# Patient Record
Sex: Female | Born: 1975 | Race: White | Hispanic: No | Smoking: Never smoker
Health system: Southern US, Community
[De-identification: ages and names within clinical notes are randomized; demographics above are authoritative.]

## PROBLEM LIST (undated history)

## (undated) DIAGNOSIS — R760 Raised antibody titer: Secondary | ICD-10-CM

## (undated) DIAGNOSIS — C4490 Unspecified malignant neoplasm of skin, unspecified: Secondary | ICD-10-CM

## (undated) DIAGNOSIS — J020 Streptococcal pharyngitis: Secondary | ICD-10-CM

## (undated) DIAGNOSIS — I639 Cerebral infarction, unspecified: Secondary | ICD-10-CM

## (undated) HISTORY — DX: Streptococcal pharyngitis: J02.0

---

## 1998-12-01 ENCOUNTER — Other Ambulatory Visit: Admission: RE | Admit: 1998-12-01 | Discharge: 1998-12-01 | Payer: Self-pay | Admitting: Obstetrics and Gynecology

## 1999-07-20 ENCOUNTER — Other Ambulatory Visit: Admission: RE | Admit: 1999-07-20 | Discharge: 1999-07-20 | Payer: Self-pay | Admitting: Obstetrics and Gynecology

## 2000-07-25 ENCOUNTER — Other Ambulatory Visit: Admission: RE | Admit: 2000-07-25 | Discharge: 2000-07-25 | Payer: Self-pay | Admitting: Obstetrics and Gynecology

## 2001-06-24 ENCOUNTER — Other Ambulatory Visit: Admission: RE | Admit: 2001-06-24 | Discharge: 2001-06-24 | Payer: Self-pay | Admitting: *Deleted

## 2002-01-21 ENCOUNTER — Inpatient Hospital Stay (HOSPITAL_COMMUNITY): Admission: AD | Admit: 2002-01-21 | Discharge: 2002-01-25 | Payer: Self-pay | Admitting: *Deleted

## 2002-02-25 ENCOUNTER — Other Ambulatory Visit: Admission: RE | Admit: 2002-02-25 | Discharge: 2002-02-25 | Payer: Self-pay | Admitting: *Deleted

## 2003-04-20 ENCOUNTER — Other Ambulatory Visit: Admission: RE | Admit: 2003-04-20 | Discharge: 2003-04-20 | Payer: Self-pay | Admitting: *Deleted

## 2004-09-14 ENCOUNTER — Inpatient Hospital Stay (HOSPITAL_COMMUNITY): Admission: RE | Admit: 2004-09-14 | Discharge: 2004-09-17 | Payer: Self-pay | Admitting: *Deleted

## 2006-07-30 ENCOUNTER — Ambulatory Visit (HOSPITAL_COMMUNITY): Admission: RE | Admit: 2006-07-30 | Discharge: 2006-07-30 | Payer: Self-pay | Admitting: *Deleted

## 2006-07-30 IMAGING — US US OB DETAIL+14 WK
1 series · 14 of 28 positions shown · non-contrast
Comparison: none

OBSTETRICAL ULTRASOUND:
 This ultrasound was performed in The [HOSPITAL], and the [facility] report will be stored to [REDACTED] PACS.

[Series 1: us ob detail+14 wk · 14 of 115 slices shown]
[im 5/115]
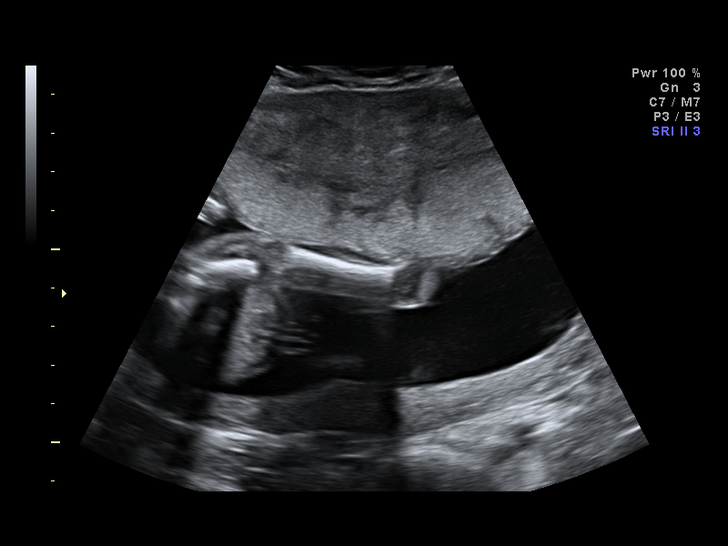
[im 13/115]
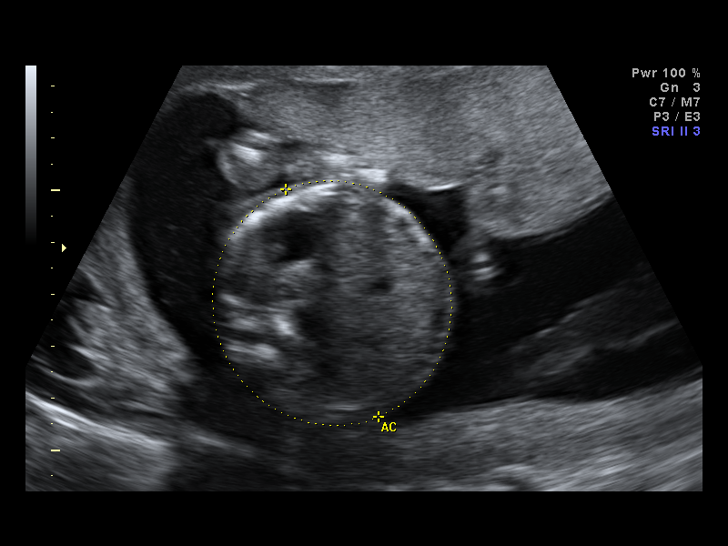
[im 22/115]
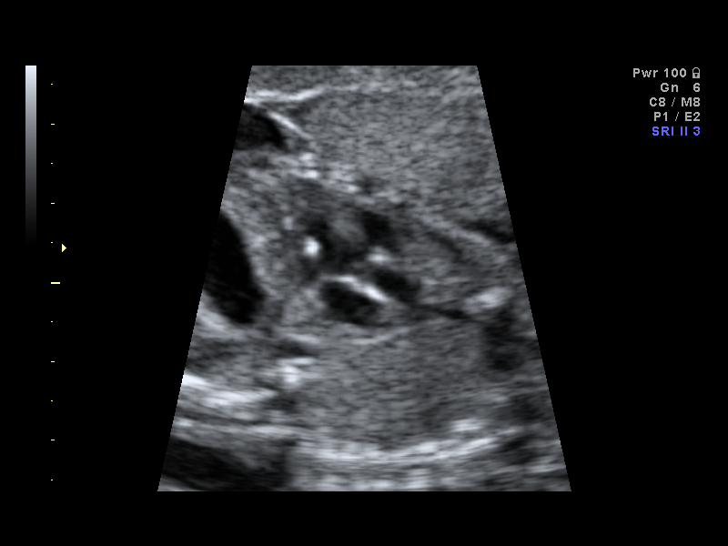
[im 30/115]
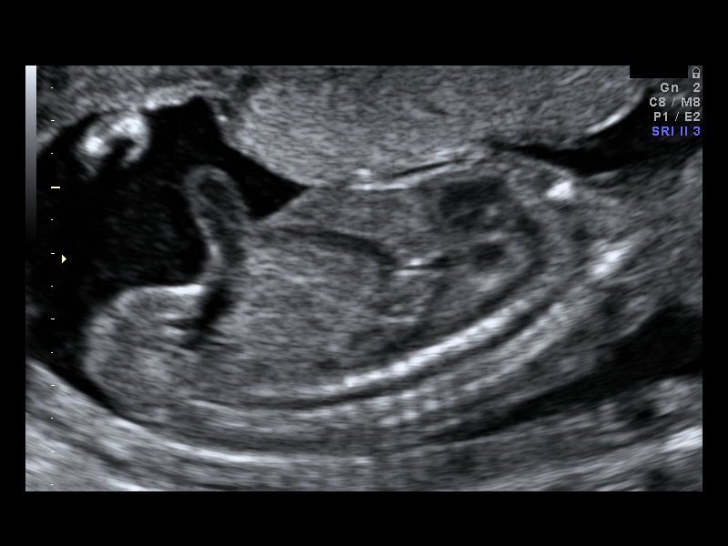
[im 39/115]
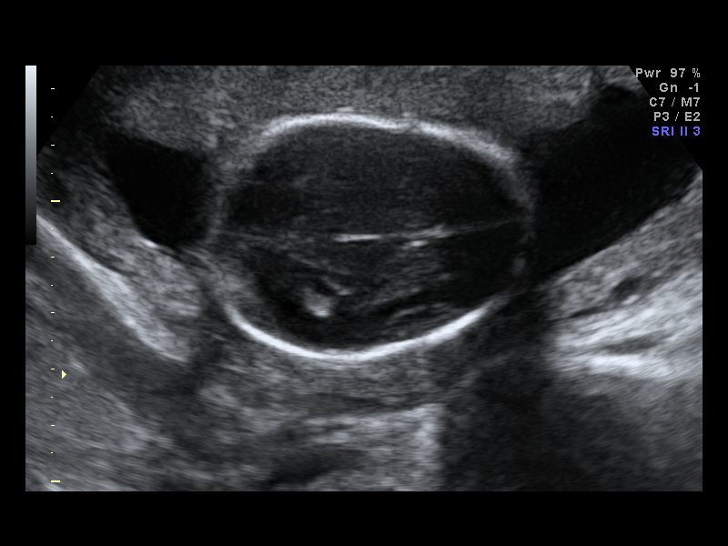
[im 47/115]
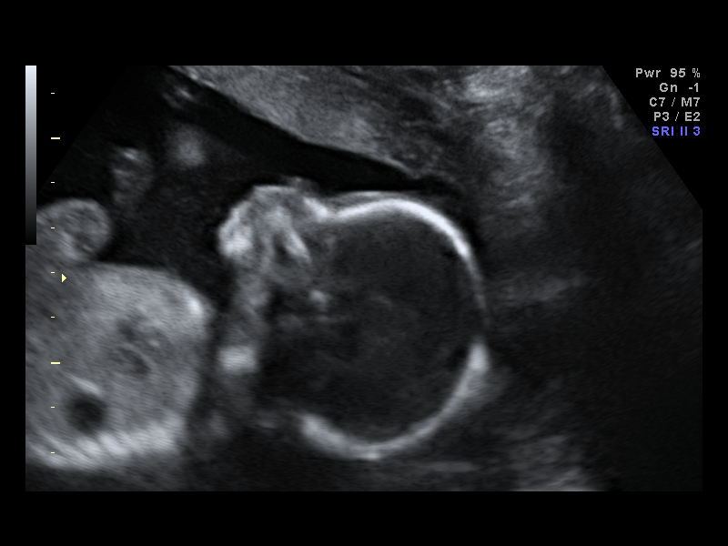
[im 55/115]
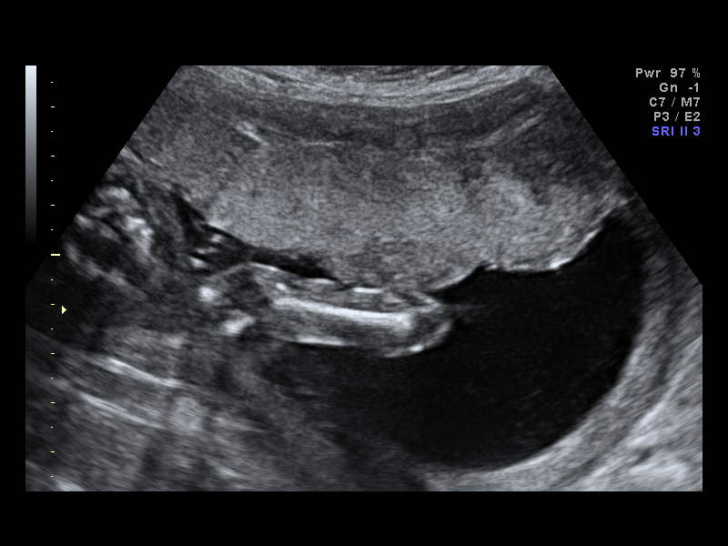
[im 64/115]
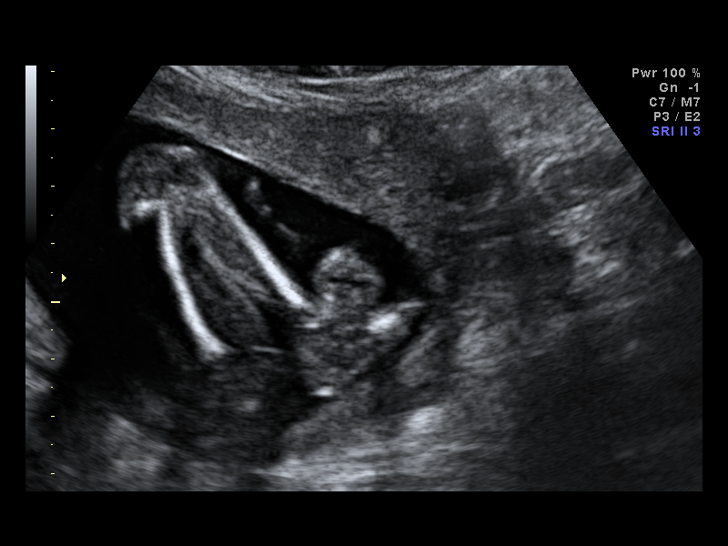
[im 72/115]
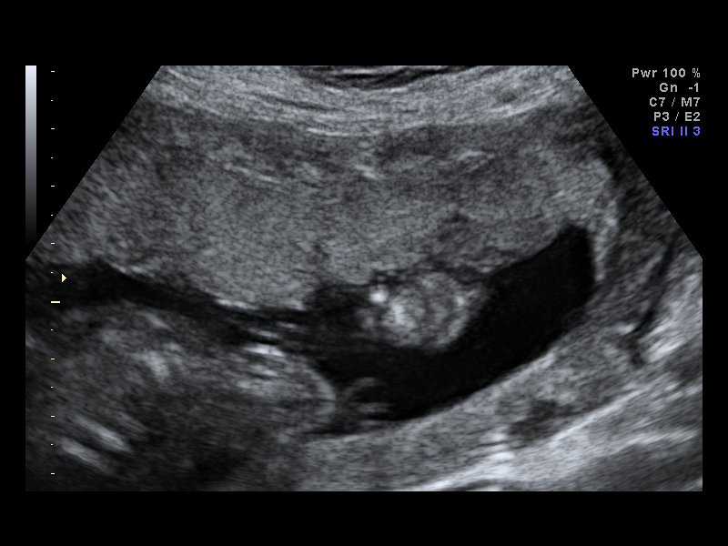
[im 81/115]
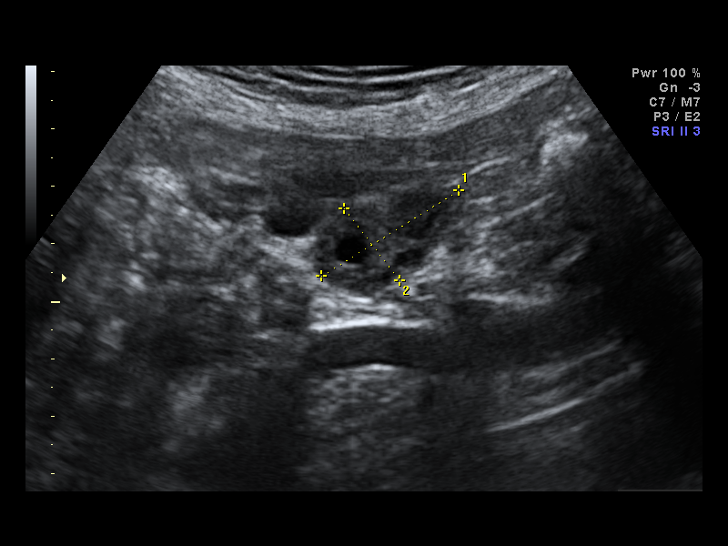
[im 89/115]
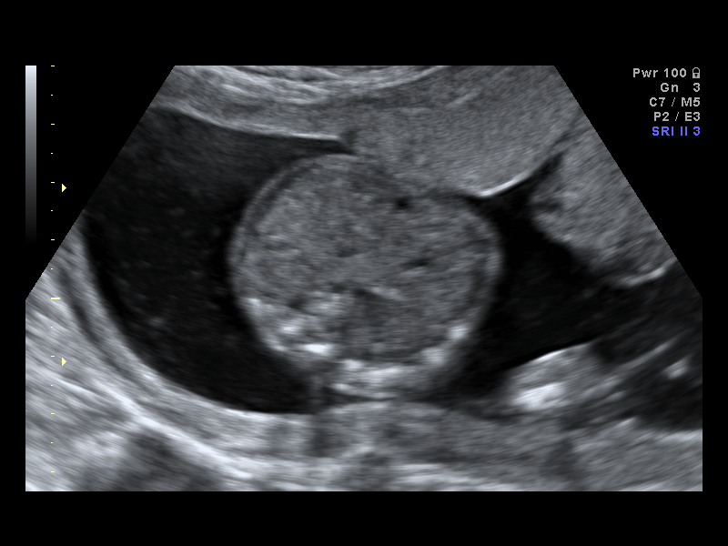
[im 98/115]
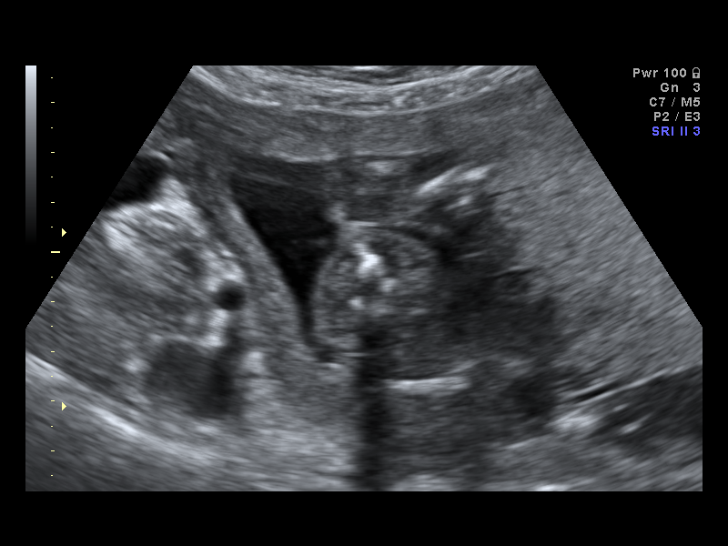
[im 106/115]
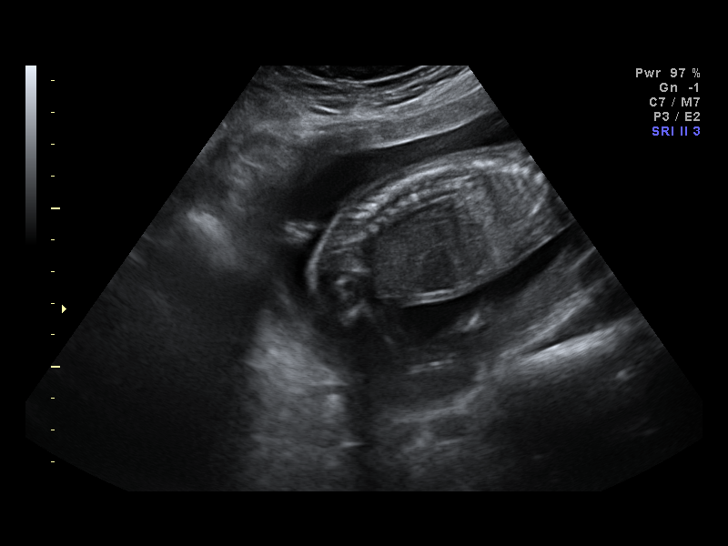
[im 115/115]
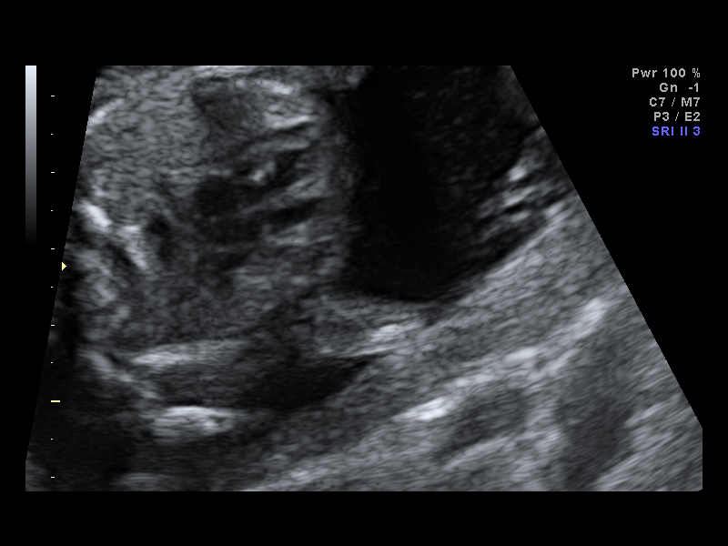

[14 of 28 positions shown; findings below may reference images not displayed]

IMPRESSION: The [facility] report has also been faxed to the ordering physician.

## 2006-11-29 ENCOUNTER — Inpatient Hospital Stay (HOSPITAL_COMMUNITY): Admission: AD | Admit: 2006-11-29 | Discharge: 2006-11-29 | Payer: Self-pay | Admitting: *Deleted

## 2006-12-11 ENCOUNTER — Inpatient Hospital Stay (HOSPITAL_COMMUNITY): Admission: AD | Admit: 2006-12-11 | Discharge: 2006-12-15 | Payer: Self-pay | Admitting: *Deleted

## 2006-12-11 ENCOUNTER — Encounter (INDEPENDENT_AMBULATORY_CARE_PROVIDER_SITE_OTHER): Payer: Self-pay | Admitting: *Deleted

## 2010-07-25 NOTE — Op Note (Signed)
NAMELEONIE, AMACHER              ACCOUNT NO.:  [id number]   MEDICAL RECORD NO.:  [id number]          PATIENT TYPE:  INP   LOCATION:  9106                          FACILITY:  [facility]   PHYSICIAN:  Gerri Spore B. Earlene Plater, M.D.  DATE OF BIRTH:  25-Oct-1975   DATE OF PROCEDURE:  12/11/2006  DATE OF DISCHARGE:                               OPERATIVE REPORT   PREOPERATIVE DIAGNOSES:  1. C-section x2.  2. A 37-week intrauterine pregnancy.  3. Spontaneous rupture of membranes.   POSTOPERATIVE DIAGNOSES:  1. C-section x2.  2. A 37-week intrauterine pregnancy.  3. Spontaneous rupture of membranes.   PROCEDURE:  Repeat low transverse C-section.   SURGEON:  Chester Holstein. Earlene Plater, M.D.   ASSISTANT:  Lendon Colonel, MD   ANESTHESIA:  Spinal.   SPECIMENS:  Placenta to pathology.   BLOOD LOSS:  900 mL.   COMPLICATIONS:  None.   FINDINGS:  Viable female, Apgars 5 and 9. Weight was pending from the  nursery.   INDICATIONS:  The patient presented with the above  history for repeat C-  section.   PROCEDURE:  The patient was taken to the operating room, spinal  anesthesia obtained.  She was prepped and draped in standard fashion.  Foley catheter was inserted into the bladder.  A Pfannenstiel skin  incision made through the previous incision and carried sharply to the  fascia.  The fascia was divided sharply in the midline.  This led to  opening of the peritoneum as well.  The remainder of the fascial  incision was extended laterally, after feeling under the fascia and  noting that it was free of any bowel adhesions or bladder adhesions.  The uterus was densely adherent to the abdominal wall and the inferior  rectus sheath on the right.  These adhesions were taken down sharply  without difficulty.   A bladder blade inserted.  A bladder flap created sharply.  Uterine  incision was made in low transverse fashion with a knife.  Clear fluid  at amniotomy; the vertex was elevated to the incision.  The  MightyVac  mushroom cup was placed on the flexion point, with a single traction;  one pop-off encountered, but the vertex delivered through the incision  without difficulty.  The nose and mouth were suctioned with the bulb;  the remainder of the infant delivered without difficulty.  The cord was  clamped and cut.  The infant was handed off to the awaiting  pediatricians.  Ancef 1 gram was given at cord clamp.   The placenta was removed manually.  The uterus was cleared of all clots  and debris.  The uterine incision was closed in 2 layers with 0 chromic,  with hemostasis obtained.   The pelvis was irrigated.  The uterine incision inspected, as was the  bladder flap and subfascial space.  All were hemostatic.  A layer of  Intercede was placed over the uterine incision.  The fascia was closed  with a running stitch of 0 Vicryl.  The subcutaneous tissue was  irrigated and made hemostatic with the Bovie.  Skin was closed with  staples.   The patient tolerated the procedure well; there were no complications.  She was taken to the recovery room; awake, alert and in stable  condition.  All counts were correct per the operating room scrub nurse.      Gerri Spore B. Earlene Plater, M.D.  Electronically Signed     WBD/MEDQ  D:  12/11/2006  T:  12/12/2006  Job:  [id number]

## 2010-07-28 NOTE — Discharge Summary (Signed)
NAMEANDALYN, HECKSTALL              ACCOUNT NO.:  [id number]   MEDICAL RECORD NO.:  [id number]          PATIENT TYPE:  INP   LOCATION:  9106                          [facility]ACILITY:  [facility]   PHYSICIAN:  Gerri Spore B. Earlene Plater, M.D.  DATE OF BIRTH:  08-16-1975   DATE OF ADMISSION:  12/11/2006  DATE OF DISCHARGE:  12/15/2006                               DISCHARGE SUMMARY   ADMISSION DIAGNOSES:  1. A 36-week intrauterine pregnancy,  2. Previous cesarean section x2.  3. Spontaneous rupture of membranes.   DISCHARGE DIAGNOSES:  1. A 36-week intrauterine pregnancy,  2. Previous cesarean section x2.  3. Spontaneous rupture of membranes.   PROCEDURES:  Repeat low transverse cesarean section.   HISTORY OF PRESENT ILLNESS:  A 35 year old white female, gravida 3, para  2, at 37+ weeks who experienced spontaneous rupture of membranes with  history of two previous cesarean sections for repeat. She was afebrile  prior to delivery.   HOSPITAL COURSE:  The patient was admitted, underwent repeat low  transverse cesarean section.  Findings at the time of surgery included  viable female.  Apgars were 5 and 9, cord pH  7..22.   Postoperatively, the patient regained her ability to ambulate, void and  tolerate a regular diet.  She was noted on the second postoperative day  to have low-grade fever of 100.5, mild fundal tenderness and generalized  malaise.  She was diagnosed with endometritis and was started on  gentamicin and clindamycin.  By the fourth postoperative day, the  patient had remained afebrile for greater than 24 hours, felt much  better, and no longer have fundal tenderness.  She was, therefore,  discharged home on the fourth postoperative day in satisfactory  condition.   DISCHARGE INSTRUCTIONS:  Per booklet.   DISCHARGE MEDICATIONS:  Tylox 1-2 p.o. q. 4-6 hours p.r.n. pain.   [facility]   DISCHARGE DISPOSITION:  Satisfactory.   Placental pathology showed mild  acute funisitis and meconium staining      Gerri Spore B. Earlene Plater, M.D.  Electronically Signed     WBD/MEDQ  D:  01/04/2007  T:  01/05/2007  Job:  [id number]

## 2010-07-28 NOTE — H&P (Signed)
NAME:  [name]                          ACCOUNT NO.:  [id number]   MEDICAL RECORD NO.:  [id number]                   PATIENT TYPE:   LOCATION:                                       FACILITY:   PHYSICIAN:  Gerri Spore B. Earlene Plater, M.D.               DATE OF BIRTH:   DATE OF ADMISSION:  01/21/2002  DATE OF DISCHARGE:                                HISTORY & PHYSICAL   ADMISSION DIAGNOSES:  1. A 39 plus week intrauterine pregnancy.  2. Elevated alpha-fetoprotein on triple screen.  3. Presumed macrosomia.   HISTORY OF PRESENT ILLNESS:  This is a 35 year old white female, gravida 1,  para 0, 39+ weeks, with EDC of January 24, 2002, admitted for induction of  labor.  She has been monitored with weekly nonstress testing and serial  ultrasounds for growth for unexplained elevated ASP.  Ultrasound of the  spine showed no defects and the patient declined amniocentesis.  The  patient's most recent ultrasound for growth at 37 weeks showed estimated  fetal weight at 8 pounds 9 ounces with normal amniotic fluid volume.  I have  recommended that the patient not progress ______________ today, given her  elevated AFP and a known association with increased risk of stillbirth.  Otherwise prenatal care has been uncomplicated, except for history of HSV of  the buttocks.  She has had no recent symptoms and is taking Valtrex for  suppressive therapy.   PAST OBSTETRICAL HISTORY:  None.   PAST SURGICAL HISTORY:  None.   FAMILY HISTORY:  Thyroid dysfunction and heart disease.   MEDICATIONS:  1. Valtrex 500 mg daily.  2. Prenatal vitamin one daily.   ALLERGIES:  None.   SOCIAL HISTORY:  No alcohol, tobacco, or drugs.   PRENATAL LABORATORY:  O positive, rubella immune.  Hepatitis B, HIV, RPR all  negative.  Group B Strep negative.   PHYSICAL EXAMINATION:  VITAL SIGNS:  Blood pressure 118/68, weight 177.  Fetal heart tones 150.  GENERAL:  Alert and oriented, no acute distress.  SKIN:  Warm and  dry, no lesions.  HEART:  Regular rate and rhythm.  LUNGS:  Clear to auscultation.  ABDOMEN:  Gravid.  Fundal height 42 cm.  PELVIC:  Cervix is 1 cm dilated, 50% effaced, -1 station, and vertex.   ASSESSMENT:  1. A 39+ week intrauterine pregnancy, elevated AFP, declined amniocentesis.  2. Presumed macrosomia.  We have discussed limitations of ultrasound in     terms of predicting the actual birth weight; however, as the patient is     now approaching 40 weeks with an elevated alpha-fetoprotein, I have     recommended induction of labor.  We discussed the possibility that     induction might increase the risk of cesarean section, as may macrosomia.  Gerri Spore B. Earlene Plater, M.D.    WBD/MEDQ  D:  01/20/2002  T:  01/20/2002  Job:  [id number]

## 2010-07-28 NOTE — Op Note (Signed)
[name], [name]                          ACCOUNT NO.:  [id number]   MEDICAL RECORD NO.:  [id number]                   PATIENT TYPE:  INP   LOCATION:  9125                                 [facility]ACILITY:  [facility]   PHYSICIAN:  Gerri Spore B. Earlene Plater, M.D.               DATE OF BIRTH:  1975-12-29   DATE OF PROCEDURE:  01/22/2002  DATE OF DISCHARGE:  01/25/2002                                 OPERATIVE REPORT   PREOPERATIVE DIAGNOSIS:  Failure to descend.   POSTOPERATIVE DIAGNOSIS:  Failure to descend.   PROCEDURE:  Primary low transverse cesarean section.   SURGEON:  Chester Holstein. Earlene Plater, M.D.   ASSISTANT:  Maxie Better, M.D.   FINDINGS:  Viable female infant, OP position, weight 9 pounds 8 ounces, Apgars  8 and 9.  Normal uterus, tubes and ovaries.  Blood loss 800 cc,  ______________.  Urine 275.   COMPLICATIONS:  None.   INDICATIONS FOR PROCEDURE:  The patient was being admitted at 39+ weeks for  induction of labor with history of unexplained elevated AFP and presenting  macrosomia.  The patient progressed well with Pitocin and amniotomy,  ultimately to complete and +1.  She pushed for about two hours with no  descent beyond +1 with persistent OP position.  As there was no descent for  two hours despite adequate pushing and the patient felt unable to continue  to push, we recommended cesarean section.   DESCRIPTION OF PROCEDURE:  The patient was taken to the operating room with  epidural anesthesia in place.  She was prepped and draped in standard  fashion and Foley catheter was already in the bladder.  A Pfannenstiel  incision was made with a knife and carried sharply to underlying fascia.  The fascia was divided in the midline with the knife and extended laterally  with Mayo scissors.  The Kocher clamp was used to elevate the superior  aspect of the incision and the underlying rectus muscles are dissected off  sharply.  __________________ in similar fashion.   The posterior sheath  and peritoneum were elevated at the midline and entered  sharply.  Extended inferiorly with good visualization of surrounding organs.   Bladder blade was inserted and the vesicouterine peritoneum identified and  elevated and bladder flap created with sharp and blunt technique.  Bladder  blade was reinserted and the uterine incision made in low transverse fashion  with a knife.  Extended laterally with bandage scissors.  Clear fluid noted  at amniotomy.  The infant's head was brought through the incision. Nose and  mouth suctioned with Bovie and the remainder of the infant was delivered  without difficulty.  Cord was clamped and cut and baby handed off to  awaiting pediatricians.  The patient was given 2 g of Cefotan at cord clamp.   The placenta was removed by manual expression and the uterus exteriorized  and clear of all clots  and debris.  The uterine incision was free of  extension.  It was closed in running stitch of 0 Monocryl.  The second  imbricating layer placed and hemostasis obtained.  Tubes and ovaries were  inspected and appeared normal.   Uterus was reinserted and the pelvis irrigated.  The uterine incision,  bladder flap and subfascial space were all inspected and were all  hemostatic.   The fascial incision was closed with running suture of 0 Vicryl.  Subcutaneous tissue was irrigated and made hemostatic with a Bovie.  Skin  was closed with staples.   The patient tolerated the procedure well without complications.  She was  taken to the recovery room awake, alert and in stable condition.  All counts  correct per the operating staff.                                               Gerri Spore B. Earlene Plater, M.D.    WBD/MEDQ  D:  03/11/2002  T:  03/11/2002  Job:  [id number]

## 2010-07-28 NOTE — Op Note (Signed)
NAMECALIAH, [name]              ACCOUNT NO.:  [id number]   MEDICAL RECORD NO.:  [id number]          PATIENT TYPE:  INP   LOCATION:  9199                          [facility]ACILITY:  [facility]   PHYSICIAN:  Gerri Spore B. Earlene Plater, M.D.  DATE OF BIRTH:  August 29, 1975   DATE OF PROCEDURE:  09/14/2004  DATE OF DISCHARGE:                                 OPERATIVE REPORT   PREOPERATIVE DIAGNOSES:  1.  Thirty-nine week intrauterine pregnancy.  2.  Previous cesarean section.  3.  Suspected large for gestational age fetus.   POSTOPERATIVE DIAGNOSES:  1.  Thirty-nine week intrauterine pregnancy.  2.  Previous cesarean section.  3.  Suspected large for gestational age fetus.   PROCEDURE:  Repeat low transverse cesarean section.   SURGEON:  Chester Holstein. Earlene Plater, M.D.   ASSISTANT:  Richardean Sale, M.D.   ANESTHESIA:  Spinal.   SPECIMENS:  None.   ESTIMATED BLOOD LOSS:  750.   COMPLICATIONS:  None.   FINDINGS:  1.  Uterus adherent in the lower segment to the abdominal wall in the right      lower quadrant.  2.  A viable female, weight 9 pounds 11 ounces, Apgars 8 and 9.  3.  Normal-appearing uterus otherwise, normal-appearing tubes and ovaries.   INDICATIONS:  Patient with previous cesarean section and suspected LGA  fetus, for repeat.  The patient was advised of the risks of surgery  including infection, bleeding, damage to surrounding organs.   PROCEDURE:  The patient was taken to the operating room and spinal  anesthesia obtained.  Prepped and draped in standard fashion and Foley  catheter inserted into the bladder.  Pfannenstiel incision made through  previous scar.  Carried to the fascia sharply.  The fascia was divided  sharply.  The underlying rectus muscles were dissected off sharply.  The  posterior sheath and peritoneum entered sharply and the uterus noted to be  adherent in the right lower quadrant to the abdominal wall.  These adhesions  were taken down sharply, bladder flap inserted, bladder  flap created with  sharp and blunt technique.   Uterine incision made in a low transverse fashion with a knife.  Clear fluid  at amniotomy.  The incision extended laterally with bandage scissors.  The  infant's head was elevated through the incision, the nose and mouth  suctioned with a bulb.  The remainder of the infant delivered without  difficulty.  Cord clamped and cut and the infant was handed off to the  awaiting pediatricians.  Ancef 1 g was given at cord clamp.   The placenta was delivered manually, uterus exteriorized and cleared of all  clots and debris.  The uterine incision was free of extension.  It was  closed in a running locked stitch of 0 chromic.  A second imbricating layer  placed of the same suture with hemostasis obtained.  The area that had been  adherent to the abdominal wall on the uterus was made hemostatic with the  Bovie and the uterus was otherwise normal.  Tubes and ovaries were normal.  The uterus was reinserted into the abdomen  and the pelvis was irrigated.  The uterine incision and the bladder flap were reinspected and both were  hemostatic, as was the subfascial space.   The fascia was closed in a running stitch of 0 Vicryl.  The subcutaneous  tissue was irrigated and made hemostatic with the Bovie.  Skin was closed  with staples.   The patient tolerated the procedure without any complications.  She was  taken to the recovery room awake and alert, in stable condition.  All counts  were correct per the operating room staff.       WBD/MEDQ  D:  09/14/2004  T:  09/14/2004  Job:  [id number]

## 2010-07-28 NOTE — Discharge Summary (Signed)
   NAMECHANLEY, [name]                          ACCOUNT NO.:  [id number]   MEDICAL RECORD NO.:  [id number]                   PATIENT TYPE:  INP   LOCATION:  9125                                 [facility]ILITY[facility]   PHYSICIAN:  Gerri Spore B. Earlene Plater, M.D.               DATE OF BIRTH:  04/12/1975   DATE OF ADMISSION:  01/21/2002  DATE OF DISCHARGE:  01/25/2002                                 DISCHARGE SUMMARY   ADMISSION DIAGNOSES:  1. Intrauterine pregnancy at 39+ weeks.  2. Unexplained elevated alpha-fetoprotein.  3. Presumed fetal macrosomia.   DISCHARGE DIAGNOSES:  1. Intrauterine pregnancy at 39+ weeks.  2. Unexplained elevated alpha-fetoprotein.  3. Presumed fetal macrosomia.  4. Failure to descend.   PROCEDURES:  1. Induction of labor.  2. Cesarean section for failure to descend.   HISTORY OF PRESENT ILLNESS:  For complete details please see the History and  Physical on the chart.  Briefly, the patient was admitted at 39+ weeks as a  gravida 1  para 0 for induction of labor for the above issues.   HOSPITAL COURSE:  The patient was admitted, induction started with Pitocin  and amniotomy.  The patient progressed to complete/complete and pushed for  greater than two hours with no descent beyond +1 station.  Given this,  recommended to proceed with cesarean section.  The patient did develop fever  around the time of initiation of the C section.   The patient was subsequently delivered by primary low transverse cesarean  section of a viable female infant, OP position, 9 pounds  8 ounces, Apgars 8 and 9 noted.  Normal uterus, tubes, and ovaries were also  seen.   The patient postoperatively rapidly began ambulating and voiding and  tolerating a regular diet.  She was kept on IV Ceftin 24 hours postpartum  with no further fever.  She was discharged home on postoperative day #3 in  satisfactory condition.  She was noted to be A positive with a discharge  hemoglobin of 8.4.   DISCHARGE MEDICATIONS:  1. Tylox one to two p.o. q.4-6h. p.r.n. pain.  2. Ferrous sulfate 325 mg p.o. b.i.d.   FOLLOW-UP:  [facility] in four to six weeks.    DISCHARGE INSTRUCTIONS:  Standard printed instructions given prior to  dismissal.   DISPOSITION AT DISCHARGE:  Satisfactory.                                               Gerri Spore B. Earlene Plater, M.D.    WBD/MEDQ  D:  02/18/2002  T:  02/18/2002  Job:  [id number]

## 2010-07-28 NOTE — Discharge Summary (Signed)
NAMESHIRRELL, [name]              ACCOUNT NO.:  [id number]   MEDICAL RECORD NO.:  [id number]          PATIENT TYPE:  INP   LOCATION:  9122                          [facility]   PHYSICIAN:  Gerri Spore B. Earlene Plater, M.D.  DATE OF BIRTH:  10/31/1975   DATE OF ADMISSION:  09/14/2004  DATE OF DISCHARGE:  09/17/2004                                 DISCHARGE SUMMARY   ADMISSION DIAGNOSES:  1.  A 39 week intrauterine pregnancy.  2.  History of previous cesarean section, for repeat.  3.  History of postpartum depression.   DISCHARGE DIAGNOSES:  1.  A 39 week intrauterine pregnancy.  2.  History of previous cesarean section, for repeat.  3.  History of postpartum depression.   PROCEDURE:  Repeat low transverse cesarean section.   HISTORY OF PRESENT ILLNESS:  A 35 year old white female gravida 2, para 1,  39 weeks, history of previous C-section for repeat.   HOSPITAL COURSE:  The patient underwent repeat low transverse cesarean  section on day of admission.  Findings included viable female, weight 9  pounds, 11 ounces, Apgars were 8 and 9.  There were some adhesions at the  uterus to the abdominal wall in the right lower quadrant which were taken  down sharply, otherwise no abnormalities noted.   Postoperatively, the patient rapidly regained her ability to ambulate, void,  and tolerate a regular diet.  She continued her Zoloft 50 mg daily but on  the third postoperative day was noticing some increased tearfulness and mild  increased depressed mood.  This was discussed at length with the patient.  She does desire discharge home.  She states she has good family support and  that her husband will be off for the next two weeks and she has family  coming in from out of town which will also be supportive and helpful.   DISCHARGE DIAGNOSES:  Standard preprinted instructions given prior to  dismissal.  In addition, the patient is instructed to call with any  increasing mood-related symptoms.   DISCHARGE MEDICATIONS:  1.  Zoloft-will increase to 100 mg p.o. q.h.s. given the recent increase in      symptoms.  2.  Tylox 1-2 p.o. q.4-6h. p.r.n. pain.   FOLLOW UP:  Dr Earlene Plater, one week.   DISPOSITION AT DISCHARGE:  Satisfactory.       WBD/MEDQ  D:  09/17/2004  T:  09/17/2004  Job:  [id number]

## 2010-12-21 LAB — CBC
HCT: 30.5 — ABNORMAL LOW
Hemoglobin: 10.4 — ABNORMAL LOW
Hemoglobin: 12.6
MCHC: 33.7
MCV: 87.7
Platelets: 125 — ABNORMAL LOW
Platelets: 139 — ABNORMAL LOW
Platelets: 160
RBC: 3.47 — ABNORMAL LOW
RBC: 3.51 — ABNORMAL LOW
RDW: 15.4 — ABNORMAL HIGH
WBC: 15.8 — ABNORMAL HIGH
WBC: 16.4 — ABNORMAL HIGH

## 2010-12-21 LAB — RPR: RPR Ser Ql: NONREACTIVE

## 2010-12-21 LAB — URINALYSIS, ROUTINE W REFLEX MICROSCOPIC
Bilirubin Urine: NEGATIVE
Glucose, UA: NEGATIVE
Hgb urine dipstick: NEGATIVE
Specific Gravity, Urine: 1.01
Urobilinogen, UA: 0.2
pH: 6.5

## 2013-03-26 ENCOUNTER — Ambulatory Visit (INDEPENDENT_AMBULATORY_CARE_PROVIDER_SITE_OTHER): Payer: 59 | Admitting: Physician Assistant

## 2013-03-26 VITALS — BP 110/64 | HR 68 | Temp 99.5°F | Resp 18 | Ht 63.0 in | Wt 125.0 lb

## 2013-03-26 DIAGNOSIS — J029 Acute pharyngitis, unspecified: Secondary | ICD-10-CM

## 2013-03-26 DIAGNOSIS — J020 Streptococcal pharyngitis: Secondary | ICD-10-CM

## 2013-03-26 LAB — POCT RAPID STREP A (OFFICE): Rapid Strep A Screen: POSITIVE — AB

## 2013-03-26 MED ORDER — AMOXICILLIN 875 MG PO TABS: 875.0000 mg | ORAL_TABLET | Freq: Two times a day (BID) | ORAL | Status: DC

## 2013-03-26 NOTE — Patient Instructions (Signed)
Get plenty of rest and drink at least 64 ounces of water daily.  Strep Throat Strep throat is an infection of the throat caused by a bacteria named Streptococcus pyogenes. Your caregiver may call the infection streptococcal "tonsillitis" or "pharyngitis" depending on whether there are signs of inflammation in the tonsils or back of the throat. Strep throat is most common in children aged 39 15 years during the cold months of the year, but it can occur in people of any age during any season. This infection is spread from person to person (contagious) through coughing, sneezing, or other close contact. SYMPTOMS   Fever or chills.  Painful, swollen, red tonsils or throat.  Pain or difficulty when swallowing.  White or yellow spots on the tonsils or throat.  Swollen, tender lymph nodes or "glands" of the neck or under the jaw.  Red rash all over the body (rare). DIAGNOSIS  Many different infections can cause the same symptoms. A test must be done to confirm the diagnosis so the right treatment can be given. A "rapid strep test" can help your caregiver make the diagnosis in a few minutes. If this test is not available, a light swab of the infected area can be used for a throat culture test. If a throat culture test is done, results are usually available in a day or two. TREATMENT  Strep throat is treated with antibiotic medicine. HOME CARE INSTRUCTIONS   Gargle with 1 tsp of salt in 1 cup of warm water, 3 4 times per day or as needed for comfort.  Family members who also have a sore throat or fever should be tested for strep throat and treated with antibiotics if they have the strep infection.  Make sure everyone in your household washes their hands well.  Do not share food, drinking cups, or personal items that could cause the infection to spread to others.  You may need to eat a soft food diet until your sore throat gets better.  Drink enough water and fluids to keep your urine clear or  pale yellow. This will help prevent dehydration.  Get plenty of rest.  Stay home from school, daycare, or work until you have been on antibiotics for 24 hours.  Only take over-the-counter or prescription medicines for pain, discomfort, or fever as directed by your caregiver.  If antibiotics are prescribed, take them as directed. Finish them even if you start to feel better. SEEK MEDICAL CARE IF:   The glands in your neck continue to enlarge.  You develop a rash, cough, or earache.  You cough up green, yellow-brown, or bloody sputum.  You have pain or discomfort not controlled by medicines.  Your problems seem to be getting worse rather than better. SEEK IMMEDIATE MEDICAL CARE IF:   You develop any new symptoms such as vomiting, severe headache, stiff or painful neck, chest pain, shortness of breath, or trouble swallowing.  You develop severe throat pain, drooling, or changes in your voice.  You develop swelling of the neck, or the skin on the neck becomes red and tender.  You have a fever.  You develop signs of dehydration, such as fatigue, dry mouth, and decreased urination.  You become increasingly sleepy, or you cannot wake up completely. Document Released: 02/24/2000 Document Revised: 02/13/2012 Document Reviewed: 04/27/2010 [facility] Patient Information 2014 [place], [place].

## 2013-03-26 NOTE — Progress Notes (Signed)
   Subjective:    Patient ID: [name], female    DOB: 1975/11/04, 38 y.o.   MRN: [id number]  PCP: No primary provider on file.  Chief Complaint  Patient presents with  . Sore Throat    x2 days     HPI  2-3 days of sore throat.  Gets this regularly, usually this time of year, sometimes "back to back."  Last episode was about a month ago.  Diagnosed by rapid strep test at work and treated with Augmentin, followed by Diflucan for a yeast infection.  Exercises regularly.  No runny nose.  No cough.  Some ear discomfort. No chills, low grade fever.  No muscle aches.  Has been very tired. No nausea, vomiting or diarrhea.  No urinary symptoms.   Review of Systems As above.    Objective:   Physical Exam  Vitals reviewed. Constitutional: She is oriented to person, place, and time. Vital signs are normal. She appears well-developed and well-nourished. No distress.  HENT:  Head: Normocephalic and atraumatic.  Right Ear: Hearing, tympanic membrane, external ear and ear canal normal.  Left Ear: Hearing, tympanic membrane, external ear and ear canal normal.  Nose: Mucosal edema present. No rhinorrhea.  No foreign bodies. Right sinus exhibits no maxillary sinus tenderness and no frontal sinus tenderness. Left sinus exhibits no maxillary sinus tenderness and no frontal sinus tenderness.  Mouth/Throat: Uvula is midline and mucous membranes are normal. No uvula swelling. Posterior oropharyngeal erythema present. No oropharyngeal exudate, posterior oropharyngeal edema or tonsillar abscesses.  Eyes: Conjunctivae and EOM are normal. Pupils are equal, round, and reactive to light. Right eye exhibits no discharge. Left eye exhibits no discharge. No scleral icterus.  Neck: Trachea normal, normal range of motion and full passive range of motion without pain. Neck supple. No mass and no thyromegaly present.  Cardiovascular: Normal rate, regular rhythm and normal heart sounds.   Pulmonary/Chest:  Effort normal and breath sounds normal.  Lymphadenopathy:       Head (right side): No submandibular, no tonsillar, no preauricular, no posterior auricular and no occipital adenopathy present.       Head (left side): No submandibular, no tonsillar, no preauricular and no occipital adenopathy present.    She has no cervical adenopathy.       Right: No supraclavicular adenopathy present.       Left: No supraclavicular adenopathy present.  Neurological: She is alert and oriented to person, place, and time. She has normal strength. No cranial nerve deficit or sensory deficit.  Skin: Skin is warm, dry and intact. No rash noted.  Psychiatric: She has a normal mood and affect. Her speech is normal and behavior is normal.      Results for orders placed in visit on 03/26/13  POCT RAPID STREP A (OFFICE)      Result Value Range   Rapid Strep A Screen Positive (*) Negative       Assessment & Plan:  1. Acute pharyngitis 2. Strep pharyngitis Supportive care.  Anticipatory guidance.  RTC if symptoms worsen/persist. - POCT rapid strep A - amoxicillin (AMOXIL) 875 MG tablet; Take 1 tablet (875 mg total) by mouth 2 (two) times daily.  Dispense: 20 tablet; Refill: 0   Fara Chute, PA-C Physician Assistant-Certified [facility]

## 2016-05-28 ENCOUNTER — Ambulatory Visit (INDEPENDENT_AMBULATORY_CARE_PROVIDER_SITE_OTHER): Payer: 59 | Admitting: Family Medicine

## 2016-05-28 VITALS — BP 126/78 | HR 66 | Temp 98.0°F | Resp 16 | Ht 63.0 in | Wt 140.6 lb

## 2016-05-28 DIAGNOSIS — L732 Hidradenitis suppurativa: Secondary | ICD-10-CM

## 2016-05-28 MED ORDER — SULFAMETHOXAZOLE-TRIMETHOPRIM 800-160 MG PO TABS: 1.0000 | ORAL_TABLET | Freq: Two times a day (BID) | ORAL | 0 refills | Status: AC

## 2016-05-28 MED ORDER — FLUCONAZOLE 150 MG PO TABS: 150.0000 mg | ORAL_TABLET | Freq: Once | ORAL | 0 refills | Status: AC

## 2016-05-28 NOTE — Progress Notes (Signed)
  Chief Complaint  Patient presents with  . Cyst    3 under right armpit    HPI   Pt reports that she went to [facility] for a cyst her right axillae She reports that she was prescribed doxycycline 3 weeks ago She reports that 5 days ago she she started having another recurrence in the same right axillae   Past Medical History:  Diagnosis Date  . Strep pharyngitis     Current Outpatient Prescriptions  Medication Sig Dispense Refill  . norethindrone-ethinyl estradiol (JUNEL FE,GILDESS FE,LOESTRIN FE) 1-20 MG-MCG tablet Take 1 tablet by mouth daily.    Marland Kitchen amoxicillin (AMOXIL) 875 MG tablet Take 1 tablet (875 mg total) by mouth 2 (two) times daily. (Patient not taking: Reported on 05/28/2016) 20 tablet 0  . sulfamethoxazole-trimethoprim (BACTRIM DS,SEPTRA DS) 800-160 MG tablet Take 1 tablet by mouth 2 (two) times daily. 14 tablet 0   No current facility-administered medications for this visit.     Allergies: No Known Allergies  Past Surgical History:  Procedure Laterality Date  . CESAREAN SECTION      Social History   Social History  . Marital status: Divorced    Spouse name: n/a  . Number of children: 3  . Years of education: N/A   Occupational History  . [place] [employer] History Main Topics  . Smoking status: Never Smoker  . Smokeless tobacco: Never Used  . Alcohol use No  . Drug use: No  . Sexual activity: Not Asked   Other Topics Concern  . None   Social History Narrative   Lives with her 3 children. And their dog.    Review of Systems  Constitutional: Negative for chills and fever.  Musculoskeletal: Negative for myalgias and neck pain.  Skin: Negative for itching and rash.    Objective: Vitals:   05/28/16 0957  BP: 126/78  Pulse: 66  Resp: 16  Temp: 98 F (36.7 C)  TempSrc: Oral  SpO2: 98%  Weight: 140 lb 9.6 oz (63.8 kg)  Height: 5' 3"  (1.6 m)    Physical Exam  Constitutional: She is oriented to person,  place, and time. She appears well-developed and well-nourished.  HENT:  Head: Normocephalic and atraumatic.  Pulmonary/Chest: Effort normal.  Musculoskeletal: Normal range of motion.  Neurological: She is alert and oriented to person, place, and time.  Skin:  Right axillae with 3 nodular lesions with palpable fluctuance. No erythema     Assessment and Plan Tarita was seen today for cyst.  Diagnoses and all orders for this visit:  Hidradenitis suppurativa of right axilla  Pt declined I&D today She has appt with Dermatology in 3 days Will start Bactrim DS today Since she completed 2 courses of doxycycline with recurrence Discussed risk and side effects of Bactrim including rash, diarrhea, nausea or vomiting Discussed that it is not known who gets reactions to antibiotics and who does not -     sulfamethoxazole-trimethoprim (BACTRIM DS,SEPTRA DS) 800-160 MG tablet; Take 1 tablet by mouth 2 (two) times daily.     [place]

## 2016-05-28 NOTE — Patient Instructions (Addendum)
IF you received an x-ray today, you will receive an invoice from [facility]. Please contact [facility] at [phone] with questions or concerns regarding your invoice.   IF you received labwork today, you will receive an invoice from [facility]. Please contact [facility] at [phone] with questions or concerns regarding your invoice.   Our billing staff will not be able to assist you with questions regarding bills from these companies.  You will be contacted with the lab results as soon as they are available. The fastest way to get your results is to activate your My Chart account. Instructions are located on the last page of this paperwork. If you have not heard from [place] regarding the results in 2 weeks, please contact this office.     Hidradenitis Suppurativa Hidradenitis suppurativa is a long-term (chronic) skin disease that starts with blocked sweat glands or hair follicles. Bacteria may grow in these blocked openings of your skin. Hidradenitis suppurativa is like a severe form of acne that develops in areas of your body where acne would be unusual. It is most likely to affect the areas of your body where skin rubs against skin and becomes moist. This includes your:  Underarms.  Groin.  Genital areas.  Buttocks.  Upper thighs.  Breasts. Hidradenitis suppurativa may start out with small pimples. The pimples can develop into deep sores that break open (rupture) and drain pus. Over time your skin may thicken and become scarred. Hidradenitis suppurativa cannot be passed from person to person. What are the causes? The exact cause of hidradenitis suppurativa is not known. This condition may be due to:  Female and female hormones. The condition is rare before and after puberty.  An overactive body defense system (immune system). Your immune system may overreact to the blocked hair follicles or sweat glands and cause swelling and pus-filled sores. What increases  the risk? You may have a higher risk of hidradenitis suppurativa if you:  Are a woman.  Are between ages 59 and 39.  Have a family history of hidradenitis suppurativa.  Have a personal history of acne.  Are overweight.  Smoke.  Take the drug lithium. What are the signs or symptoms? The first signs of an outbreak are usually painful skin bumps that look like pimples. As the condition progresses:  Skin bumps may get bigger and grow deeper into the skin.  Bumps under the skin may rupture and drain smelly pus.  Skin may become itchy and infected.  Skin may thicken and scar.  Drainage may continue through tunnels under the skin (fistulas).  Walking and moving your arms can become painful. How is this diagnosed? Your health care provider may diagnose hidradenitis suppurativa based on your medical history and your signs and symptoms. A physical exam will also be done. You may need to see a health care provider who specializes in skin diseases (dermatologist). You may also have tests done to confirm the diagnosis. These can include:  Swabbing a sample of pus or drainage from your skin so it can be sent to the lab and tested for infection.  Blood tests to check for infection. How is this treated? The same treatment will not work for everybody with hidradenitis suppurativa. Your treatment will depend on how severe your symptoms are. You may need to try several treatments to find what works best for you. Part of your treatment may include cleaning and bandaging (dressing) your wounds. You may also have to take medicines, such as the  following:  Antibiotics.  Acne medicines.  Medicines to block or suppress the immune system.  A diabetes medicine (metformin) is sometimes used to treat this condition.  For women, birth control pills can sometimes help relieve symptoms. You may need surgery if you have a severe case of hidradenitis suppurativa that does not respond to medicine.  Surgery may involve:  Using a laser to clear the skin and remove hair follicles.  Opening and draining deep sores.  Removing the areas of skin that are diseased and scarred. Follow these instructions at home:  Learn as much as you can about your disease, and work closely with your health care providers.  Take medicines only as directed by your health care provider.  If you were prescribed an antibiotic medicine, finish it all even if you start to feel better.  If you are overweight, losing weight may be very helpful. Try to reach and maintain a healthy weight.  Do not use any tobacco products, including cigarettes, chewing tobacco, or electronic cigarettes. If you need help quitting, ask your health care provider.  Do not shave the areas where you get hidradenitis suppurativa.  Do not wear deodorant.  Wear loose-fitting clothes.  Try not to overheat and get sweaty.  Take a daily bleach bath as directed by your health care provider.  Fill your bathtub halfway with water.  Pour in  cup of unscented household bleach.  Soak for 5-10 minutes.  Cover sore areas with a warm, clean washcloth (compress) for 5-10 minutes. Contact a health care provider if:  You have a flare-up of hidradenitis suppurativa.  You have chills or a fever.  You are having trouble controlling your symptoms at home. This information is not intended to replace advice given to you by your health care provider. Make sure you discuss any questions you have with your health care provider. Document Released: 10/11/2003 Document Revised: 08/04/2015 Document Reviewed: 05/29/2013 Elsevier Interactive Patient Education  2017 [employer].

## 2019-03-17 DIAGNOSIS — I7771 Dissection of carotid artery: Secondary | ICD-10-CM

## 2019-03-17 DIAGNOSIS — I639 Cerebral infarction, unspecified: Secondary | ICD-10-CM

## 2019-03-17 HISTORY — DX: Dissection of carotid artery: I77.71

## 2019-03-17 HISTORY — DX: Cerebral infarction, unspecified: I63.9

## 2019-03-24 DIAGNOSIS — I7771 Dissection of carotid artery: Secondary | ICD-10-CM | POA: Insufficient documentation

## 2019-03-24 DIAGNOSIS — R768 Other specified abnormal immunological findings in serum: Secondary | ICD-10-CM | POA: Insufficient documentation

## 2019-03-24 DIAGNOSIS — E785 Hyperlipidemia, unspecified: Secondary | ICD-10-CM | POA: Insufficient documentation

## 2019-06-04 DIAGNOSIS — I6381 Other cerebral infarction due to occlusion or stenosis of small artery: Secondary | ICD-10-CM | POA: Insufficient documentation

## 2019-12-07 DIAGNOSIS — Z8673 Personal history of transient ischemic attack (TIA), and cerebral infarction without residual deficits: Secondary | ICD-10-CM | POA: Insufficient documentation

## 2020-08-31 ENCOUNTER — Ambulatory Visit (INDEPENDENT_AMBULATORY_CARE_PROVIDER_SITE_OTHER): Payer: 59 | Admitting: Podiatry

## 2020-08-31 ENCOUNTER — Other Ambulatory Visit: Payer: Self-pay

## 2020-08-31 ENCOUNTER — Ambulatory Visit (INDEPENDENT_AMBULATORY_CARE_PROVIDER_SITE_OTHER): Payer: Self-pay

## 2020-08-31 DIAGNOSIS — M722 Plantar fascial fibromatosis: Secondary | ICD-10-CM

## 2020-08-31 MED ORDER — MELOXICAM 15 MG PO TABS: 15.0000 mg | ORAL_TABLET | Freq: Every day | ORAL | 1 refills | Status: DC

## 2020-08-31 MED ORDER — METHYLPREDNISOLONE 4 MG PO TBPK: ORAL_TABLET | ORAL | 0 refills | Status: DC

## 2020-08-31 NOTE — Patient Instructions (Signed)
Plantar Fasciitis  Plantar fasciitis is a painful foot condition that affects the heel. It occurs when the band of tissue that connects the toes to the heel bone (plantar fascia) becomes irritated. This can happen as the result of exercising too much or doing other repetitive activities (overuse injury). Plantar fasciitis can cause mild irritation to severe pain that makes it difficult to walk or move. The pain is usually worse in the morning after sleeping, or after sitting or lying down for a period of time. Pain may also beworse after long periods of walking or standing. What are the causes? This condition may be caused by: Standing for long periods of time. Wearing shoes that do not have good arch support. Doing activities that put stress on joints (high-impact activities). This includes ballet and exercise that makes your heart beat faster (aerobic exercise), such as running. Being overweight. An abnormal way of walking (gait). Tight muscles in the back of your lower leg (calf). High arches in your feet or flat feet. Starting a new athletic activity. What are the signs or symptoms? The main symptom of this condition is heel pain. Pain may get worse after the following: Taking the first steps after a time of rest, especially in the morning after awakening, or after you have been sitting or lying down for a while. Long periods of standing still. Pain may decrease after 30-45 minutes of activity, such as gentle walking. How is this diagnosed? This condition may be diagnosed based on your medical history, a physical exam, and your symptoms. Your health care provider will check for: A tender area on the bottom of your foot. A high arch in your foot or flat feet. Pain when you move your foot. Difficulty moving your foot. You may have imaging tests to confirm the diagnosis, such as: X-rays. Ultrasound. MRI. How is this treated? Treatment for plantar fasciitis depends on how severe your  condition is. Treatment may include: Rest, ice, pressure (compression), and raising (elevating) the affected foot. This is called RICE therapy. Your health care provider may recommend RICE therapy along with over-the-counter pain medicines to manage your pain. Exercises to stretch your calves and your plantar fascia. A splint that holds your foot in a stretched, upward position while you sleep (night splint). Physical therapy to relieve symptoms and prevent problems in the future. Injections of steroid medicine (cortisone) to relieve pain and inflammation. Stimulating your plantar fascia with electrical impulses (extracorporeal shock wave therapy). This is usually the last treatment option before surgery. Surgery, if other treatments have not worked after 12 months. Follow these instructions at home: Managing pain, stiffness, and swelling  If directed, put ice on the painful area. To do this: Put ice in a plastic bag, or use a frozen bottle of water. Place a towel between your skin and the bag or bottle. Roll the bottom of your foot over the bag or bottle. Do this for 20 minutes, 2-3 times a day. Wear athletic shoes that have air-sole or gel-sole cushions, or try soft shoe inserts that are designed for plantar fasciitis. Elevate your foot above the level of your heart while you are sitting or lying down.  Activity Avoid activities that cause pain. Ask your health care provider what activities are safe for you. Do physical therapy exercises and stretches as told by your health care provider. Try activities and forms of exercise that are easier on your joints (low impact). Examples include swimming, water aerobics, and biking. General instructions Take over-the-counter  and prescription medicines only as told by your health care provider. Wear a night splint while sleeping, if told by your health care provider. Loosen the splint if your toes tingle, become numb, or turn cold and blue. Maintain  a healthy weight, or work with your health care provider to lose weight as needed. Keep all follow-up visits. This is important. Contact a health care provider if you have: Symptoms that do not go away with home treatment. Pain that gets worse. Pain that affects your ability to move or do daily activities. Summary Plantar fasciitis is a painful foot condition that affects the heel. It occurs when the band of tissue that connects the toes to the heel bone (plantar fascia) becomes irritated. Heel pain is the main symptom of this condition. It may get worse after exercising too much or standing still for a long time. Treatment varies, but it usually starts with rest, ice, pressure (compression), and raising (elevating) the affected foot. This is called RICE therapy. Over-the-counter medicines can also be used to manage pain. This information is not intended to replace advice given to you by your health care provider. Make sure you discuss any questions you have with your healthcare provider. Document Revised: 06/15/2019 Document Reviewed: 06/15/2019 Elsevier Patient Education  2022 [employer].

## 2020-08-31 NOTE — Progress Notes (Signed)
   Subjective: 45 y.o. female presenting today as a new patient for evaluation of right heel pain this been going on for few months now.  Denies a history of injury.  She has been taking ibuprofen for the pain.  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  She presents for further treatment and evaluation   Past Medical History:  Diagnosis Date   Strep pharyngitis      Objective: Physical Exam General: The patient is alert and oriented x3 in no acute distress.  Dermatology: Skin is warm, dry and supple bilateral lower extremities. Negative for open lesions or macerations bilateral.   Vascular: Dorsalis Pedis and Posterior Tibial pulses palpable bilateral.  Capillary fill time is immediate to all digits.  Neurological: Epicritic and protective threshold intact bilateral.   Musculoskeletal: Tenderness to palpation to the plantar aspect of the right heel along the plantar fascia. All other joints range of motion within normal limits bilateral. Strength 5/5 in all groups bilateral.   Radiographic exam: Normal osseous mineralization. Joint spaces preserved. No fracture/dislocation/boney destruction. No other soft tissue abnormalities or radiopaque foreign bodies.   Assessment: 1. Plantar fasciitis right  Plan of Care:  1. Patient evaluated. Xrays reviewed.   2.  Patient declined injections today 3. Rx for Medrol Dose Pack placed 4. Rx for Meloxicam ordered for patient. 5. Plantar fascial band(s) dispensed 6. Instructed patient regarding therapies and modalities at home to alleviate symptoms.  7.  Patient has a pair of OTC arch supports from [employer].  Recommend that she resumes wearing the arch supports  8.  Return to clinic in 4 weeks.    Dispensing optician for [employer]   Edrick Kins, [facility]  Dr. Edrick Kins, DPM    [address], [place] [address]                Office [phone]  Fax [phone]

## 2020-10-05 ENCOUNTER — Other Ambulatory Visit: Payer: Self-pay

## 2020-10-05 ENCOUNTER — Ambulatory Visit: Payer: 59 | Admitting: Podiatry

## 2020-10-05 DIAGNOSIS — M722 Plantar fascial fibromatosis: Secondary | ICD-10-CM | POA: Diagnosis not present

## 2020-10-05 NOTE — Progress Notes (Signed)
   Subjective: 45 y.o. female presenting today for follow-up treatment and evaluation of plantar fasciitis to the right foot that has been present now for about 4 months.  She says that the prednisone pack helped significantly with the pain however the pain is slowly recurred.  She currently wears OTC super feet insoles and new balance shoes.  She says that the plantar fascial brace also helps.  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  She presents for further treatment and evaluation   Past Medical History:  Diagnosis Date   Strep pharyngitis      Objective: Physical Exam General: The patient is alert and oriented x3 in no acute distress.  Dermatology: Skin is warm, dry and supple bilateral lower extremities. Negative for open lesions or macerations bilateral.   Vascular: Dorsalis Pedis and Posterior Tibial pulses palpable bilateral.  Capillary fill time is immediate to all digits.  Neurological: Epicritic and protective threshold intact bilateral.   Musculoskeletal: There continues to be tenderness to palpation to the plantar aspect of the right heel along the plantar fascia. All other joints range of motion within normal limits bilateral. Strength 5/5 in all groups bilateral.    Assessment: 1. Plantar fasciitis right  Plan of Care:  1. Patient evaluated.  2.  Continue plantar fascial brace 3.  Continue super feet OTC insoles 4.  Continue meloxicam 15 mg daily 5.  Today the patient was molded for custom molded orthotics 6.  Return to clinic in 4 weeks to pick up orthotics  Dispensing optician for [employer]   Edrick Kins, [facility]  Dr. Edrick Kins, DPM    [address], [place] [address]                Office [phone]  Fax [phone]

## 2020-10-30 ENCOUNTER — Other Ambulatory Visit: Payer: Self-pay | Admitting: Podiatry

## 2020-10-31 ENCOUNTER — Other Ambulatory Visit: Payer: Self-pay

## 2020-10-31 ENCOUNTER — Ambulatory Visit: Payer: 59 | Admitting: Podiatry

## 2020-10-31 ENCOUNTER — Ambulatory Visit (INDEPENDENT_AMBULATORY_CARE_PROVIDER_SITE_OTHER): Payer: 59 | Admitting: *Deleted

## 2020-10-31 DIAGNOSIS — M722 Plantar fascial fibromatosis: Secondary | ICD-10-CM | POA: Diagnosis not present

## 2020-10-31 MED ORDER — MELOXICAM 15 MG PO TABS: 15.0000 mg | ORAL_TABLET | Freq: Every day | ORAL | 1 refills | Status: DC

## 2020-10-31 NOTE — Progress Notes (Signed)
   Subjective: 45 y.o. female presenting today for follow-up treatment and evaluation of plantar fasciitis to the right foot that has been present now for about 5 months.  She states that the pain in her heel is about the same.  She continues to decline injections.  She has been taking the meloxicam daily.  She presents for further treatment and evaluation   Past Medical History:  Diagnosis Date   Strep pharyngitis      Objective: Physical Exam General: The patient is alert and oriented x3 in no acute distress.  Dermatology: Skin is warm, dry and supple bilateral lower extremities. Negative for open lesions or macerations bilateral.   Vascular: Dorsalis Pedis and Posterior Tibial pulses palpable bilateral.  Capillary fill time is immediate to all digits.  Neurological: Epicritic and protective threshold intact bilateral.   Musculoskeletal: There continues to be tenderness to palpation to the plantar aspect of the right heel along the plantar fascia. All other joints range of motion within normal limits bilateral. Strength 5/5 in all groups bilateral.    Assessment: 1. Plantar fasciitis right  Plan of Care:  1. Patient evaluated.  Patient continues to decline injections.  She says that she will pass out with the injection 2.  Continue plantar fascial brace 3.  Continue super feet OTC insoles.  Patient's custom molded orthotics are pending 4.  Continue meloxicam 15 mg daily.  Refill provided 5.  Night splint provided today to wear nightly as tolerated  6.  Return to clinic in 4 weeks or sooner if orthotics become available  Dispensing optician for [employer]   Edrick Kins, [facility]  Dr. Edrick Kins, DPM    [address], [place] [address]                Office [phone]  Fax [phone]

## 2020-10-31 NOTE — Progress Notes (Signed)
Patient presents today to pick up custom molded foot orthotics, diagnosed with plantar fasciitis by Dr. Amalia Hailey.   Orthotics were dispensed and fit was satisfactory. Reviewed instructions for break-in and wear. Written instructions given to patient.  Patient will follow up as needed.

## 2020-11-28 ENCOUNTER — Ambulatory Visit: Payer: 59 | Admitting: Podiatry

## 2020-11-28 ENCOUNTER — Other Ambulatory Visit: Payer: Self-pay

## 2020-11-28 DIAGNOSIS — M722 Plantar fascial fibromatosis: Secondary | ICD-10-CM | POA: Diagnosis not present

## 2020-11-28 NOTE — Progress Notes (Signed)
   Subjective: 45 y.o. female presenting today for follow-up treatment and evaluation of plantar fasciitis to the right foot that has been present now for about 5 months.  Patient has been wearing the custom molded orthotics that were dispensed and she says that they are doing very well.  Overall she says there is some improvement no new complaints at this time   Past Medical History:  Diagnosis Date   Strep pharyngitis      Objective: Physical Exam General: The patient is alert and oriented x3 in no acute distress.  Dermatology: Skin is warm, dry and supple bilateral lower extremities. Negative for open lesions or macerations bilateral.   Vascular: Dorsalis Pedis and Posterior Tibial pulses palpable bilateral.  Capillary fill time is immediate to all digits.  Neurological: Epicritic and protective threshold intact bilateral.   Musculoskeletal: There continues to be minimal tenderness to palpation to the plantar aspect of the right heel along the plantar fascia. All other joints range of motion within normal limits bilateral. Strength 5/5 in all groups bilateral.    Assessment: 1. Plantar fasciitis right  Plan of Care:  1. Patient evaluated.  Patient continues to decline injections.   2.  The custom orthotics are doing well.  Continue wearing daily 3.  Continue meloxicam as needed daily 4.  The patient use the night splint in the evenings.  Continue as tolerated 5.  Return to clinic as needed  Dispensing optician for [employer]   Edrick Kins, [facility]  Dr. Edrick Kins, DPM    [address], [place] [address]                Office [phone]  Fax [phone]

## 2020-12-30 LAB — COLOGUARD: COLOGUARD: NEGATIVE

## 2023-12-26 ENCOUNTER — Other Ambulatory Visit: Payer: Self-pay | Admitting: Otolaryngology

## 2023-12-26 NOTE — Progress Notes (Signed)
????????????????????????    Patient:?  [name] DOB:?  09/22/1975  Sex:   female EMRN:?  [id number]  Encounter Date:  12/26/2023     ?  ?  Orders  ?  FACILITY:?? [facility] ?  SURGEON:?? Elspeth Coddington, MD ?  SURGERY DATE:? 01/01/24 Wed  ???????????TIME: ??9:00 am????????????????????   ?  ANESTHESIA:?? General ?  STATUS:??Outpatient  ?  PROCEDURE:?? REPAIR OF LEFT UPPER LIP MOHS DEFECT WITH      *EXCISION OF WOUND - 84995     *ADJACENT TISSUE TRANSFER TO LIP - 14060 ?  DIAGNOSIS:? 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  ?  LATEX ALLERGY: ?  ?  EQUIPMENT: ????????????  ?  INSURANCE:?  [employer] PPO EPO CHOICE SELECT # [id number]   SECONDARY:?????  ??  PRECERT#: ?

## 2023-12-30 ENCOUNTER — Other Ambulatory Visit: Payer: Self-pay

## 2023-12-30 ENCOUNTER — Encounter (HOSPITAL_COMMUNITY): Payer: Self-pay | Admitting: Otolaryngology

## 2023-12-30 NOTE — Progress Notes (Signed)
 SDW CALL  Patient was given pre-op instructions over the phone. The opportunity was given for the patient to ask questions. No further questions asked. Patient verbalized understanding of instructions given.  January 01, 2024 @ 7:00 am.   PCP - Nifong, Inocente Rouse, MD Neurology -Omega Mutton, DO  Cardiologist - Per patient no longer follows with Cardiology  PPM/ICD - denies Device Orders - n/a Rep Notified - n/a  Chest x-ray - denies EKG - denies Stress Test - denies ECHO - 10-08-22 Cardiac Cath - denies TEE- 11-28-22  Sleep Study - denies CPAP - n/a  Dm -denies  Last dose of GLP1 agonist-  WEGOVY Last dose 12-25-23 GLP1 instructions:  Instructed to hold dose on Wednesday  Blood Thinner Instructions: Eliquis last dose per patient 12-27-23 Aspirin Instructions:denies  ERAS Protcol - Clear liquids until 6:30 am   COVID TEST- no    Anesthesia review: yes, hx of stroke  Patient denies shortness of breath, fever, cough and chest pain over the phone call   All instructions explained to the patient, with a verbal understanding of the material. Patient agrees to go over the instructions while at home for a better understanding.

## 2023-12-31 ENCOUNTER — Encounter (HOSPITAL_COMMUNITY): Payer: Self-pay | Admitting: Otolaryngology

## 2023-12-31 NOTE — Anesthesia Preprocedure Evaluation (Signed)
 Anesthesia Evaluation  Patient identified by MRN, date of birth, ID band Patient awake    Reviewed: Allergy & Precautions, NPO status , Patient's Chart, lab work & pertinent test results  History of Anesthesia Complications Negative for: history of anesthetic complications  Airway Mallampati: IV  TM Distance: >3 FB Neck ROM: Full  Mouth opening: Limited Mouth Opening Comment: Mouth opening limited by lip wound Dental no notable dental hx. (+) Teeth Intact   Pulmonary neg pulmonary ROS, neg sleep apnea, neg COPD, Patient abstained from smoking.Not current smoker   Pulmonary exam normal breath sounds clear to auscultation       Cardiovascular Exercise Tolerance: Good METS(-) hypertension(-) CAD and (-) Past MI negative cardio ROS (-) dysrhythmias  Rhythm:Regular Rate:Normal - Systolic murmurs    Neuro/Psych CVA, No Residual Symptoms  negative psych ROS   GI/Hepatic ,neg GERD  ,,(+)     (-) substance abuse    Endo/Other  neg diabetes    Renal/GU negative Renal ROS     Musculoskeletal   Abdominal   Peds  Hematology   Anesthesia Other Findings History includes never smoker, 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 July 2021 follow-up CTA showed healed CTA dissection and sounds like she was transitioned back to ASA. In March 2024 she had a MRI for memory loss also showed evidence of other small old infarcts. She had additional testing through cardiology and rheumatology. She had a TTE  in July 2024 that showed normal LVEF with positive bubble study. TEE on 11/28/2022 showed LVEF 55-60%, no LAA thrombus, late shunting in the cardiac cycle consistent with intrapulmonary shunting, no PFO, no significant  valvular disease. No sustained rhythm abnormalities by 12/2022 event monitor. She was evaluated by cardiologist Charlestine Nottingham, MD, and as of 02/01/2023 ASA and Crestor recommended for CVA prevention with as needed cardiology follow-up. Rheumatology did APS testing beta-2-glycoprotein IgG and IgM were negative, lupus anticoagulant screen was negative, but anticardiolipin IgM was elevated at 62. This was rechecked 07/04/23, and remained elevated at 53. She was referred to hematology for + antiphospholipid Ab.    She had hematology evaluation on 08/08/2023 with Dr. Emmalene. He wrote, Based on the revised Sapporo criteria, the patient meets diagnostic criteria for antiphospholipid syndrome on the basis of prior arterial thrombotic event and moderately-positive serologic test on repeat assessment--however, we discussed that presence of low-moderate-titer IgM anticardiolipin antibodies is one of the weakest associations with respect to thrombophilia.  - Based on prior thrombotic history and excellent tolerance of systemic anticoagulation, I recommended patient resume apixaban for prophylaxis of additional thrombotic events per PCP... He recommended again oral estrogens and androgens given increased thrombotic risk. Repeat labs and 3 month follow-up planned.    Last neurology follow-up with Dr. Talitha on 10/31/2023.  He felt was okay to stop aspirin given she was on anticoagulation and because stroke was from bilateral carotid dissections.  Continue statin use.  She has frequent headaches since her stroke and some short-term memory changes.  She felt memory was improving.  She exercises regularly.  He discussed referral to neuropsychology if she felt memory was worsening and offered genetic testing for probable connective tissue disorder if she desired.     Last Wegovoy was 12/25/2023. Last Eliquis 12/27/2023.   Reproductive/Obstetrics  Anesthesia  Physical Anesthesia Plan  ASA: 3  Anesthesia Plan: General   Post-op Pain Management: Ofirmev IV (intra-op)*   Induction: Intravenous  PONV Risk Score and Plan: 4 or greater and Ondansetron, Dexamethasone and Midazolam  Airway Management Planned: Oral ETT  Additional Equipment: None  Intra-op Plan:   Post-operative Plan: Extubation in OR  Informed Consent: I have reviewed the patients History and Physical, chart, labs and discussed the procedure including the risks, benefits and alternatives for the proposed anesthesia with the patient or authorized representative who has indicated his/her understanding and acceptance.     Dental advisory given  Plan Discussed with: CRNA and Surgeon  Anesthesia Plan Comments: (Discussed risks of anesthesia with patient, including PONV, sore throat, lip/dental/eye damage. Rare risks discussed as well, such as cardiorespiratory and neurological sequelae, and allergic reactions. Discussed the role of CRNA in patient's perioperative care. Patient understands. Nasal vs oral ETT depending on surgeon preference)         Anesthesia Quick Evaluation

## 2023-12-31 NOTE — Progress Notes (Signed)
 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 July 2021 follow-up CTA showed healed CTA dissection and sounds like she was transitioned back to ASA. In March 2024 she had a MRI for memory loss also showed evidence of other small old infarcts. She had additional testing through cardiology and rheumatology. She had a TTE  in July 2024 that showed normal LVEF with positive bubble study. TEE on 11/28/2022 showed LVEF 55-60%, no LAA thrombus, late shunting in the cardiac cycle consistent with intrapulmonary shunting, no PFO, no significant valvular disease. No sustained rhythm abnormalities by 12/2022 event monitor. She was evaluated by cardiologist Charlestine Nottingham, MD, and as of 02/01/2023 ASA and Crestor recommended for CVA prevention with as needed cardiology follow-up. Rheumatology did APS testing beta-2-glycoprotein IgG and IgM were negative, lupus anticoagulant screen was negative, but anticardiolipin IgM was elevated at 62. This was rechecked 07/04/23, and remained elevated at 53. She was referred to hematology for + antiphospholipid Ab.   She had hematology  evaluation on 08/08/2023 with Dr. Emmalene. He wrote, Based on the revised Sapporo criteria, the patient meets diagnostic criteria for antiphospholipid syndrome on the basis of prior arterial thrombotic event and moderately-positive serologic test on repeat assessment--however, we discussed that presence of low-moderate-titer IgM anticardiolipin antibodies is one of the weakest associations with respect to thrombophilia.  - Based on prior thrombotic history and excellent tolerance of systemic anticoagulation, I recommended patient resume apixaban for prophylaxis of additional thrombotic events per PCP... He recommended again oral estrogens and androgens given increased thrombotic risk. Repeat labs and 3 month follow-up planned.   Last neurology follow-up with Dr. Talitha on 10/31/2023.  He felt was okay to stop aspirin given she was on anticoagulation and because stroke was from bilateral carotid dissections.  Continue statin use.  She has frequent headaches since her stroke and some short-term memory changes.  She felt memory was improving.  She exercises regularly.  He discussed referral to neuropsychology if she felt memory was worsening and offered genetic testing for probable connective tissue disorder if she desired.    Last Wegovoy was 12/25/2023. Last Eliquis 12/27/2023.  She is a same day work-up, so anesthesia team to evaluate on the day of surgery.   VS: Ht 5' 4 (1.626 m)   Wt 64.9 kg   BMI 24.55 kg/m  Per [facility]: Blood Pressure 98/58 10/31/2023 1:37 PM EDT    Pulse 66 10/31/2023 1:37 PM EDT    Temperature 36.6 C (97.8 F) 10/31/2023 1:37 PM EDT    Respiratory Rate 18 10/31/2023 1:37 PM EDT    Oxygen Saturation 98% 10/31/2023 1:37 PM EDT  Inhaled Oxygen Concentration - -    Weight 64.9 kg (143 lb) 10/31/2023 1:37 PM EDT pt stated  Height 162.6 cm (5' 4) 10/31/2023 1:37 PM EDT    Body Mass Index 24.55 10/31/2023 1:37 PM EDT     PROVIDERS: Rose Inocente Rouse, MD is PCP  Talitha Del, DO is neurologist Emmalene Grip, MD is CHERIE Mc, Rahel, MD is cardiologist. As needed follow-up per 02/01/2023 viist.  Justino Corners, PA-C is rheumatology provider   LABS: For day of surgery as indicated. As of 07/29/2023, CBC and aPTT were normal. Normal BMP on 11/28/2022.    OTHER: EEG 03/18/2019 ([facility]): INTERPRETATION:   -Drowsiness only was achieved  -No clear epileptiform activity was noted and therefore no definite  evidence was noted for a seizure disorder.   -The slow transients noted in the right  temporal region indicate a   slight focal neurophysiological disturbance in the right temporal region.    IMAGES: MRI Head 05/30/2022 ([facility]): IMPRESSION:  1. Old lacunar infarctions in the bilateral globi pallidi (right larger than left).  2. Several old small lacunar-type infarctions in the bilateral frontal lobe white matter (centrum semiovale), more numerous on the right side.  3. Prominent mucosal thickening and secretions, including air-fluid levels, in both maxillary sinuses. Milder mucosal thickening and secretions elsewhere in the paranasal sinuses. Correlate clinically for any signs/symptoms of sinusitis.  4. No evidence of acute intracranial abnormality.   CTA Neck 09/16/2019 ([facility]): IMPRESSION:  - Interval healing of both internal carotid artery dissections. There is no luminal stenosis, irregularity or pseudoaneurysm on either side.  - The vertebral arteries are normal.  -Carotid stenosis measurements are obtained using NASCET criteria   MRV Head Venogram 03/17/2019 ([facility]): IMPRESSION: Dural sinuses appear intact without focal abnormality.    EKG:  EKG 08/21/2022 ([facility]): Per Narrative in CE: EKG  Normal sinus rhythm  LAE    CV: 30 Day Cardiac Event Monitor 01/10/2023: Findings  The patient was monitored for a total of 15d 11h, underlying rhythm is Sinus.   The minimum heart rate was 48 bpm  The maximum 164 bpm; the average 76 bpm.   0 % of Atrial fibrillation/Atrial flutter with longest episode of 0 ms.  The total burden of AV Block present was 0 %  There were 0 pauses  Total count of Ventricular Tachycardia (VT): 0 episode(s).  0 supraventricular episodes were found.  There were a total of 14 PVCs with 3 morphologies and 0 couplets. Overall PVC Burden at < 0.01 %  There were a total of 82 PSVCs with 2 couplets. Overall PSVC Burden at < 0.01 %  There is a total of 1 patient events.  Impression  No sustained rhythm abnormalities    TEE 11/28/2022 ([facility]): IMPRESSION: Left Ventricle: Systolic function is normal. EF: 55-60%.    Left Atrium: There is no thrombus in the left atrial appendage.    Left Atrium: There was shunting seen late in the cardiac cycle which  was consistent with intrapulmonary shunting.    Left Atrium: No patent foramen ovale visualized.   Normal LVEF  No PFO  Late shunting seen during saline contrast that was consistent with  intrapulmonary shunting    TTE 10/08/2022 ([facility]): Left Ventricle: Systolic function is normal. EF: 60-65%. Quantitative  analysis of left ventricular Global Longitudinal Strain (GLS) imaging is  -20.100%. Ejection fraction measured by 3D is 57%,    Left Atrium: Injection of agitated saline documents an interatrial  shunt under basal conditions.   Normal LVEF  Positive bubble study    US  Carotid US  05/30/2022 ([facility]): IMPRESSION:  1. No hemodynamically significant stenosis on either side.   2.  Both vertebral arteries are patent with antegrade flow.    Renal Artery Duplex 09/16/2019: Conclusion:  1. Right: No evidence of significant renal artery stenosis, < 60%.  2.   Left:    No evidence of significant renal artery stenosis, < 60%.   Past Medical History:  Diagnosis Date   Antiphospholipid antibody positive    Carotid dissection, bilateral 03/17/2019   Skin cancer    Strep pharyngitis    Stroke (HCC) 03/17/2019    Past Surgical History:   Procedure Laterality Date   CESAREAN SECTION      MEDICATIONS: No current facility-administered medications for this encounter.    acyclovir (ZOVIRAX) 400 MG tablet   apixaban (ELIQUIS) 5 MG TABS tablet   ascorbic acid (VITAMIN C) 500 MG tablet   EC-RX Testosterone 20 % CREA   estradiol (VIVELLE-DOT) 0.075 MG/24HR   FIBER ADULT GUMMIES PO   Melatonin 10 MG CHEW   progesterone (PROMETRIUM) 100 MG capsule   rosuvastatin (CRESTOR) 5 MG tablet   semaglutide-weight management (WEGOVY) 1.7 MG/0.75ML SOAJ SQ injection    Maille Halliwell, PA-C Surgical Short Stay/Anesthesiology [facility] Phone [phone] [facility] Phone [phone] 12/31/2023 1:58 PM

## 2024-01-01 ENCOUNTER — Encounter (HOSPITAL_COMMUNITY)
Admission: RE | Disposition: A | Discharge status: Home / Self Care | Payer: Self-pay | Source: Home / Self Care | Attending: Otolaryngology

## 2024-01-01 ENCOUNTER — Other Ambulatory Visit: Payer: Self-pay

## 2024-01-01 ENCOUNTER — Ambulatory Visit (HOSPITAL_COMMUNITY): Admitting: Vascular Surgery

## 2024-01-01 ENCOUNTER — Encounter (HOSPITAL_COMMUNITY): Payer: Self-pay | Admitting: Otolaryngology

## 2024-01-01 ENCOUNTER — Ambulatory Visit (HOSPITAL_COMMUNITY)
Admission: RE | Admit: 2024-01-01 | Discharge: 2024-01-01 | Disposition: A | Discharge status: Home / Self Care | Attending: Otolaryngology | Admitting: Otolaryngology

## 2024-01-01 DIAGNOSIS — R760 Raised antibody titer: Secondary | ICD-10-CM | POA: Diagnosis not present

## 2024-01-01 DIAGNOSIS — K130 Diseases of lips: Secondary | ICD-10-CM

## 2024-01-01 DIAGNOSIS — Z7901 Long term (current) use of anticoagulants: Secondary | ICD-10-CM | POA: Diagnosis not present

## 2024-01-01 DIAGNOSIS — Z8679 Personal history of other diseases of the circulatory system: Secondary | ICD-10-CM | POA: Insufficient documentation

## 2024-01-01 DIAGNOSIS — C4401 Basal cell carcinoma of skin of lip: Secondary | ICD-10-CM | POA: Diagnosis present

## 2024-01-01 DIAGNOSIS — E785 Hyperlipidemia, unspecified: Secondary | ICD-10-CM | POA: Diagnosis not present

## 2024-01-01 DIAGNOSIS — Z8673 Personal history of transient ischemic attack (TIA), and cerebral infarction without residual deficits: Secondary | ICD-10-CM | POA: Insufficient documentation

## 2024-01-01 DIAGNOSIS — Z79899 Other long term (current) drug therapy: Secondary | ICD-10-CM | POA: Insufficient documentation

## 2024-01-01 DIAGNOSIS — I639 Cerebral infarction, unspecified: Secondary | ICD-10-CM

## 2024-01-01 HISTORY — DX: Unspecified malignant neoplasm of skin, unspecified: C44.90

## 2024-01-01 HISTORY — PX: EXCISION MASS HEAD: SHX6702

## 2024-01-01 HISTORY — DX: Cerebral infarction, unspecified: I63.9

## 2024-01-01 HISTORY — DX: Raised antibody titer: R76.0

## 2024-01-01 LAB — POCT PREGNANCY, URINE: Preg Test, Ur: NEGATIVE

## 2024-01-01 SURGERY — EXCISION, MASS, HEAD
Anesthesia: General | Site: Face | Laterality: Left

## 2024-01-01 MED ORDER — CEFAZOLIN SODIUM-DEXTROSE 2-4 GM/100ML-% IV SOLN
2.0000 g | INTRAVENOUS | Status: AC
  Administered 2024-01-01: 2 g via INTRAVENOUS
  Filled 2024-01-01: qty 100

## 2024-01-01 MED ORDER — FENTANYL CITRATE (PF) 100 MCG/2ML IJ SOLN: 25.0000 ug | INTRAMUSCULAR | Status: DC | PRN

## 2024-01-01 MED ORDER — CHLORHEXIDINE GLUCONATE 0.12 % MT SOLN
15.0000 mL | Freq: Once | OROMUCOSAL | Status: AC
  Administered 2024-01-01: 15 mL via OROMUCOSAL
  Filled 2024-01-01: qty 15

## 2024-01-01 MED ORDER — ACETAMINOPHEN 10 MG/ML IV SOLN: 1000.0000 mg | Freq: Once | INTRAVENOUS | Status: DC | PRN

## 2024-01-01 MED ORDER — PROPOFOL 10 MG/ML IV BOLUS
INTRAVENOUS | Status: DC | PRN
  Administered 2024-01-01: 60 mg via INTRAVENOUS

## 2024-01-01 MED ORDER — LACTATED RINGERS IV SOLN
INTRAVENOUS | Status: DC
Start: 2024-01-01 — End: 2024-01-01

## 2024-01-01 MED ORDER — ONDANSETRON HCL 4 MG/2ML IJ SOLN
INTRAMUSCULAR | Status: DC | PRN
  Administered 2024-01-01: 4 mg via INTRAVENOUS

## 2024-01-01 MED ORDER — DEXMEDETOMIDINE HCL IN NACL 80 MCG/20ML IV SOLN
INTRAVENOUS | Status: DC | PRN
  Administered 2024-01-01: 8 ug via INTRAVENOUS
  Administered 2024-01-01: 4 ug via INTRAVENOUS

## 2024-01-01 MED ORDER — OXYCODONE HCL 5 MG PO TABS: 5.0000 mg | ORAL_TABLET | Freq: Four times a day (QID) | ORAL | 0 refills | Status: AC | PRN

## 2024-01-01 MED ORDER — OXYCODONE HCL 5 MG PO TABS: 5.0000 mg | ORAL_TABLET | Freq: Once | ORAL | Status: DC | PRN

## 2024-01-01 MED ORDER — FENTANYL CITRATE (PF) 250 MCG/5ML IJ SOLN
INTRAMUSCULAR | Status: DC | PRN
  Administered 2024-01-01: 100 ug via INTRAVENOUS

## 2024-01-01 MED ORDER — LIDOCAINE-EPINEPHRINE 1 %-1:100000 IJ SOLN
INTRAMUSCULAR | Status: AC
Start: 2024-01-01 — End: 2024-01-01
  Filled 2024-01-01: qty 1

## 2024-01-01 MED ORDER — MIDAZOLAM HCL 2 MG/2ML IJ SOLN
INTRAMUSCULAR | Status: AC
  Filled 2024-01-01: qty 2

## 2024-01-01 MED ORDER — LIDOCAINE 2% (20 MG/ML) 5 ML SYRINGE
INTRAMUSCULAR | Status: DC | PRN
  Administered 2024-01-01: 100 mg via INTRAVENOUS

## 2024-01-01 MED ORDER — DROPERIDOL 2.5 MG/ML IJ SOLN: 0.6250 mg | Freq: Once | INTRAMUSCULAR | Status: DC | PRN

## 2024-01-01 MED ORDER — 0.9 % SODIUM CHLORIDE (POUR BTL) OPTIME
TOPICAL | Status: DC | PRN
Start: 2024-01-01 — End: 2024-01-01
  Administered 2024-01-01: 1000 mL

## 2024-01-01 MED ORDER — ROCURONIUM BROMIDE 10 MG/ML (PF) SYRINGE
PREFILLED_SYRINGE | INTRAVENOUS | Status: DC | PRN
  Administered 2024-01-01: 50 mg via INTRAVENOUS

## 2024-01-01 MED ORDER — PHENYLEPHRINE HCL-NACL 20-0.9 MG/250ML-% IV SOLN
INTRAVENOUS | Status: DC | PRN
  Administered 2024-01-01: 50 ug/min via INTRAVENOUS

## 2024-01-01 MED ORDER — ORAL CARE MOUTH RINSE: 15.0000 mL | Freq: Once | OROMUCOSAL | Status: AC

## 2024-01-01 MED ORDER — OXYCODONE HCL 5 MG/5ML PO SOLN: 5.0000 mg | Freq: Once | ORAL | Status: DC | PRN

## 2024-01-01 MED ORDER — MIDAZOLAM HCL (PF) 2 MG/2ML IJ SOLN
INTRAMUSCULAR | Status: DC | PRN
  Administered 2024-01-01: 2 mg via INTRAVENOUS

## 2024-01-01 MED ORDER — PROPOFOL 10 MG/ML IV BOLUS
INTRAVENOUS | Status: AC
  Filled 2024-01-01: qty 20

## 2024-01-01 MED ORDER — BACITRACIN ZINC 500 UNIT/GM EX OINT
TOPICAL_OINTMENT | CUTANEOUS | Status: AC
  Filled 2024-01-01: qty 28.35

## 2024-01-01 MED ORDER — FENTANYL CITRATE (PF) 100 MCG/2ML IJ SOLN
INTRAMUSCULAR | Status: AC
  Filled 2024-01-01: qty 2

## 2024-01-01 MED ORDER — BACITRACIN ZINC 500 UNIT/GM EX OINT
TOPICAL_OINTMENT | CUTANEOUS | Status: DC | PRN
  Administered 2024-01-01: 1 via TOPICAL

## 2024-01-01 MED ORDER — PHENYLEPHRINE 80 MCG/ML (10ML) SYRINGE FOR IV PUSH (FOR BLOOD PRESSURE SUPPORT)
PREFILLED_SYRINGE | INTRAVENOUS | Status: DC | PRN
  Administered 2024-01-01: 80 ug via INTRAVENOUS

## 2024-01-01 MED ORDER — DEXAMETHASONE SOD PHOSPHATE PF 10 MG/ML IJ SOLN
INTRAMUSCULAR | Status: DC | PRN
  Administered 2024-01-01: 10 mg via INTRAVENOUS

## 2024-01-01 MED ORDER — LIDOCAINE-EPINEPHRINE 1 %-1:100000 IJ SOLN
INTRAMUSCULAR | Status: DC | PRN
Start: 2024-01-01 — End: 2024-01-01
  Administered 2024-01-01: 6 mL

## 2024-01-01 MED ORDER — SUGAMMADEX SODIUM 200 MG/2ML IV SOLN
INTRAVENOUS | Status: DC | PRN
  Administered 2024-01-01: 200 mg via INTRAVENOUS

## 2024-01-01 SURGICAL SUPPLY — 28 items
APPLICATOR DR MATTHEWS STRL (MISCELLANEOUS) IMPLANT
BAG COUNTER SPONGE SURGICOUNT (BAG) ×1 IMPLANT
BLADE SURG 15 STRL LF DISP TIS (BLADE) IMPLANT
CLEANER TIP ELECTROSURG 2X2 (MISCELLANEOUS) ×1 IMPLANT
COVER SURGICAL LIGHT HANDLE (MISCELLANEOUS) ×1 IMPLANT
DERMABOND ADVANCED .7 DNX12 (GAUZE/BANDAGES/DRESSINGS) ×1 IMPLANT
DRAPE HALF SHEET 40X57 (DRAPES) IMPLANT
DRSG TELFA 3X8 NADH STRL (GAUZE/BANDAGES/DRESSINGS) IMPLANT
ELECT COATED BLADE 2.86 ST (ELECTRODE) ×1 IMPLANT
ELECTRODE REM PT RTRN 9FT ADLT (ELECTROSURGICAL) IMPLANT
GAUZE 4X4 16PLY ~~LOC~~+RFID DBL (SPONGE) ×1 IMPLANT
GLOVE BIO SURGEON STRL SZ7.5 (GLOVE) ×1 IMPLANT
GLOVE BIOGEL PI IND STRL 8 (GLOVE) ×1 IMPLANT
GOWN STRL REUS W/ TWL LRG LVL3 (GOWN DISPOSABLE) ×1 IMPLANT
GOWN STRL REUS W/ TWL XL LVL3 (GOWN DISPOSABLE) ×1 IMPLANT
KIT BASIN OR (CUSTOM PROCEDURE TRAY) ×1 IMPLANT
KIT TURNOVER KIT B (KITS) ×1 IMPLANT
NDL 27GX1/2 REG BEVEL ECLIP (NEEDLE) ×1 IMPLANT
NEEDLE 27GX1/2 REG BEVEL ECLIP (NEEDLE) ×1 IMPLANT
PAD ARMBOARD POSITIONER FOAM (MISCELLANEOUS) ×2 IMPLANT
PENCIL SMOKE EVACUATOR (MISCELLANEOUS) ×1 IMPLANT
SOLN 0.9% NACL POUR BTL 1000ML (IV SOLUTION) ×1 IMPLANT
SUT ETHILON 6 0 9-3 1X18 BLK (SUTURE) IMPLANT
SUT MON AB 5-0 P3 18 (SUTURE) IMPLANT
SUT VIC AB 4-0 PS2 18 (SUTURE) IMPLANT
SYR CONTROL 10ML LL (SYRINGE) ×1 IMPLANT
TOWEL GREEN STERILE (TOWEL DISPOSABLE) ×1 IMPLANT
TRAY ENT MC OR (CUSTOM PROCEDURE TRAY) ×1 IMPLANT

## 2024-01-01 NOTE — Op Note (Signed)
 OPERATIVE NOTE  Khamiyah Grefe Date/Time of Admission: 01/01/2024  6:46 AM  CSN: [id number];MRN:[id number] Attending Provider: No att. providers found Room/Bed: MCPO/NONE DOB: 05/05/1975 Age: 48 y.o.   Pre-Op Diagnosis: Basal cell carcinoma of skin of left upper lip; Lip deformity, acquired  Post-Op Diagnosis: Basal cell carcinoma of skin of left upper lip; Lip deformity, acquired  Procedure: Adjacent tissue transfer lip 1.8 cm2 (CPT 14060) Excision wound head <100cm2 in preparation for graft (CPT 15004)  Anesthesia: General  Surgeon(s): Elspeth KANDICE Coddington, MD  Staff: Circulator: Welford Stevenson NOVAK, RN Scrub Person: Perez-Vasquez, Tiffany  Implants: * No implants in log *  Specimens: * No specimens in log *  Complications: none  EBL: 10 ML  IVF: Per anesthesia ML  Condition: stable  Operative Findings:  1.5cm circular left upper cutaneous lip defect downt o orbicularis oris musculature    Description of Operation:  Patient was identified in the preoperative area and consent confirmed in the chart.  She was brought to the operating by anesthesia and a preoperative timeout was performed confirming the patient's identity and procedure to be performed.  Once all were in agreement we proceeded with surgery.  General anesthesia was induced and the patient was intubated with an oral endotracheal tube.  The tube was secured.  The head of bed was turned 90 degrees away from anesthesia.  The patient's bandages were removed demonstrating 1.5 cm left upper cutaneous lip circular defect.  The nasolabial fold was marked out with a marking pen.  A note flap was designed with the long limb of the flap within the nasolabial fold with a 60 degree backcut.  The long limb was 1.5 times the diameter of the flap (~2.25cm) and the short limb was the length of the diameter (1.5cm).  The wound was infiltrated with local anesthetic.  The patient was then prepped and draped in standard sterile  fashion for procedure of this kind.  Final preoperative pause was performed and we will proceed with surgery.  I began with excision wound of head less than 100 cm2 in preparation for flap.  Double-pronged skin hooks were applied and 15 blade was used to excise the margin sharply in a subcutaneous plane.  Bleeding was controlled Bovie.  I then proceeded with adjacent tissue transfer of the lip 1.8 cm.  I used a note transposition type of flap for closure of the wound.  15 blade was used to incise the nasolabial fold with a backcut into the left cheek as described above.  Subcutaneous plane was dissected circumferentially to allow for ease of tension on closure.  The flap was inset with buried interrupted 5-0 Monocryl for the deep dermal subcutaneous layer and interrupted 6-0 nylon for the skin.  Of note there was no significant distortion of the lip.  Hemostasis was confirmed.  The patient's wounds were cleansed, dressed with bacitracin, Telfa, brown paper tape.  Patient tolerated the procedure well and went to the recovery room in stable condition.   Elspeth KANDICE Coddington, MD [facility]  01/01/2024

## 2024-01-01 NOTE — Transfer of Care (Signed)
 Immediate Anesthesia Transfer of Care Note  Patient: [name]  Procedure(s) Performed: EXCISION, MASS, FACE (Left: Face)  Patient Location: PACU  Anesthesia Type:General  Level of Consciousness: drowsy and responds to stimulation  Airway & Oxygen Therapy: Patient Spontanous Breathing  Post-op Assessment: Report given to RN and Post -op Vital signs reviewed and stable  Post vital signs: Reviewed and stable  Last Vitals:  Vitals Value Taken Time  BP 99/56 01/01/24 11:45  Temp    Pulse 83 01/01/24 11:45  Resp 24 01/01/24 11:45  SpO2 94 % 01/01/24 11:45  Vitals shown include unfiled device data.  Last Pain:  Vitals:   01/01/24 0750  TempSrc:   PainSc: 0-No pain         Complications: No notable events documented.

## 2024-01-01 NOTE — Progress Notes (Signed)
 No labs needed per dr. Boone; only EKG

## 2024-01-01 NOTE — Anesthesia Procedure Notes (Signed)
 Procedure Name: Intubation Date/Time: 01/01/2024 10:30 AM  Performed by: Virgil Ee, CRNAPre-anesthesia Checklist: Patient identified, Patient being monitored, Timeout performed, Emergency Drugs available and Suction available Patient Re-evaluated:Patient Re-evaluated prior to induction Oxygen Delivery Method: Circle system utilized Preoxygenation: Pre-oxygenation with 100% oxygen Induction Type: IV induction Ventilation: Mask ventilation without difficulty Laryngoscope Size: Glidescope and 4 Grade View: Grade I Tube type: Oral Tube size: 6.5 mm Number of attempts: 1 Airway Equipment and Method: Stylet Placement Confirmation: ETT inserted through vocal cords under direct vision, positive ETCO2 and breath sounds checked- equal and bilateral Secured at: 22 cm Tube secured with: Tape Dental Injury: Teeth and Oropharynx as per pre-operative assessment

## 2024-01-01 NOTE — Discharge Instructions (Signed)

## 2024-01-01 NOTE — Anesthesia Postprocedure Evaluation (Signed)
 Anesthesia Post Note  Patient: [name]  Procedure(s) Performed: EXCISION, MASS, FACE (Left: Face)     Patient location during evaluation: PACU Anesthesia Type: General Level of consciousness: awake and alert Pain management: pain level controlled Vital Signs Assessment: post-procedure vital signs reviewed and stable Respiratory status: spontaneous breathing, nonlabored ventilation, respiratory function stable and patient connected to nasal cannula oxygen Cardiovascular status: blood pressure returned to baseline and stable Postop Assessment: no apparent nausea or vomiting Anesthetic complications: no   No notable events documented.  Last Vitals:  Vitals:   01/01/24 1215 01/01/24 1230  BP: (!) 108/54 (!) 98/47  Pulse: 73 71  Resp: 20 18  Temp:  37 C  SpO2: 93% 94%    Last Pain:  Vitals:   01/01/24 1230  TempSrc:   PainSc: 0-No pain                 Rome Ade

## 2024-01-01 NOTE — H&P (Signed)
 [name] is an 48 y.o. female.    Chief Complaint:  BCC skin of nose   HPI: Patient presents today for planned elective procedure.  He/she denies any interval change in history since office visit on 10/07/23.   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 She has never used smokeless tobacco. She reports that she does not drink alcohol and does not use drugs.  Allergies:  Allergies  Allergen Reactions   Wound Dressing Adhesive Rash    Medications Prior to Admission  Medication Sig Dispense Refill   acetaminophen (TYLENOL) 500 MG tablet Take 1,000 mg by mouth every 6 (six) hours as needed for moderate pain (pain score 4-6).     acyclovir (ZOVIRAX) 400 MG tablet Take 400 mg by mouth 2 (two) times daily as needed (cold sores).     apixaban (ELIQUIS) 5 MG TABS tablet Take 5 mg by mouth 2 (two) times daily.     ascorbic acid (VITAMIN C) 500 MG tablet Take 1,000 mg by mouth daily.     EC-RX Testosterone 20 % CREA Place 40 mg onto the skin at bedtime.     estradiol (VIVELLE-DOT) 0.075 MG/24HR Place 1 patch onto the skin 2 (two) times a week.     FIBER ADULT GUMMIES PO Take 2 each by mouth daily.     ibuprofen (ADVIL) 100 MG tablet Take 100 mg by mouth every 6 (six) hours as needed for fever.     Melatonin 10 MG CHEW Chew 10 mg by mouth at bedtime.     progesterone (PROMETRIUM) 100 MG capsule Take 200 mg by mouth at bedtime.     rosuvastatin (CRESTOR) 5 MG tablet Take 5 mg by mouth every evening.     semaglutide-weight management (WEGOVY) 1.7 MG/0.75ML SOAJ SQ injection Inject 1.7 mg into  the skin once a week.      Results for orders placed or performed during the hospital encounter of 01/01/24 (from the past 48 hours)  Pregnancy, urine POC     Status: None   Collection Time: 01/01/24  8:04 AM  Result Value Ref Range   Preg Test, Ur NEGATIVE NEGATIVE    Comment:        THE SENSITIVITY OF THIS METHODOLOGY IS >20 mIU/mL.    No results found.  ROS: negative other than stated in HPI  Blood pressure 122/62, pulse 70, temperature 98.5 F (36.9 C), temperature source Oral, resp. rate 17, height 5' 4 (1.626 m), weight 65.8 kg, SpO2 99%.  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 Informed consent obtained. RBA discussed.     Electronically signed by:  Elspeth Coddington, MD  Facial Plastic & Reconstructive Surgery Otolaryngology - Head and Neck Surgery [facility]  01/01/2024, 9:49 AM

## 2024-01-02 ENCOUNTER — Encounter (HOSPITAL_COMMUNITY): Payer: Self-pay | Admitting: Otolaryngology
